# Patient Record
Sex: Male | Born: 1935 | Race: White | Hispanic: No | Marital: Married | Smoking: Former smoker
Health system: Southern US, Community
[De-identification: ages and names within clinical notes are randomized; demographics above are authoritative.]

## PROBLEM LIST (undated history)

## (undated) DIAGNOSIS — N200 Calculus of kidney: Secondary | ICD-10-CM

## (undated) DIAGNOSIS — G44009 Cluster headache syndrome, unspecified, not intractable: Secondary | ICD-10-CM

## (undated) DIAGNOSIS — K219 Gastro-esophageal reflux disease without esophagitis: Secondary | ICD-10-CM

## (undated) DIAGNOSIS — I10 Essential (primary) hypertension: Secondary | ICD-10-CM

## (undated) DIAGNOSIS — I714 Abdominal aortic aneurysm, without rupture, unspecified: Secondary | ICD-10-CM

## (undated) DIAGNOSIS — F0390 Unspecified dementia without behavioral disturbance: Secondary | ICD-10-CM

## (undated) DIAGNOSIS — I2510 Atherosclerotic heart disease of native coronary artery without angina pectoris: Secondary | ICD-10-CM

## (undated) DIAGNOSIS — S86819A Strain of other muscle(s) and tendon(s) at lower leg level, unspecified leg, initial encounter: Secondary | ICD-10-CM

## (undated) DIAGNOSIS — M6530 Trigger finger, unspecified finger: Secondary | ICD-10-CM

## (undated) DIAGNOSIS — E785 Hyperlipidemia, unspecified: Secondary | ICD-10-CM

## (undated) DIAGNOSIS — S838X9A Sprain of other specified parts of unspecified knee, initial encounter: Secondary | ICD-10-CM

## (undated) DIAGNOSIS — M51379 Other intervertebral disc degeneration, lumbosacral region without mention of lumbar back pain or lower extremity pain: Secondary | ICD-10-CM

## (undated) DIAGNOSIS — N400 Enlarged prostate without lower urinary tract symptoms: Secondary | ICD-10-CM

## (undated) DIAGNOSIS — M5137 Other intervertebral disc degeneration, lumbosacral region: Secondary | ICD-10-CM

## (undated) HISTORY — PX: PROSTATECTOMY: SHX69

## (undated) HISTORY — DX: Gastro-esophageal reflux disease without esophagitis: K21.9

## (undated) HISTORY — DX: Calculus of kidney: N20.0

## (undated) HISTORY — DX: Other intervertebral disc degeneration, lumbosacral region without mention of lumbar back pain or lower extremity pain: M51.379

## (undated) HISTORY — DX: Abdominal aortic aneurysm, without rupture, unspecified: I71.40

## (undated) HISTORY — DX: Sprain of other specified parts of unspecified knee, initial encounter: S83.8X9A

## (undated) HISTORY — DX: Essential (primary) hypertension: I10

## (undated) HISTORY — PX: CORONARY ARTERY BYPASS GRAFT: SHX141

## (undated) HISTORY — DX: Hyperlipidemia, unspecified: E78.5

## (undated) HISTORY — DX: Trigger finger, unspecified finger: M65.30

## (undated) HISTORY — PX: INGUINAL HERNIA REPAIR: SHX194

## (undated) HISTORY — DX: Abdominal aortic aneurysm, without rupture: I71.4

## (undated) HISTORY — DX: Atherosclerotic heart disease of native coronary artery without angina pectoris: I25.10

## (undated) HISTORY — DX: Strain of other muscle(s) and tendon(s) at lower leg level, unspecified leg, initial encounter: S86.819A

## (undated) HISTORY — PX: ABDOMINAL AORTIC ANEURYSM REPAIR: SUR1152

## (undated) HISTORY — DX: Unspecified dementia, unspecified severity, without behavioral disturbance, psychotic disturbance, mood disturbance, and anxiety: F03.90

## (undated) HISTORY — PX: NEPHROSTOMY / NEPHROTOMY: SUR883

## (undated) HISTORY — PX: VENTRAL HERNIA REPAIR: SHX424

## (undated) HISTORY — DX: Benign prostatic hyperplasia without lower urinary tract symptoms: N40.0

## (undated) HISTORY — DX: Other intervertebral disc degeneration, lumbosacral region: M51.37

## (undated) HISTORY — DX: Cluster headache syndrome, unspecified, not intractable: G44.009

## (undated) HISTORY — PX: CORONARY STENT PLACEMENT: SHX1402

---

## 1997-12-17 ENCOUNTER — Ambulatory Visit (HOSPITAL_COMMUNITY): Admission: RE | Admit: 1997-12-17 | Discharge: 1997-12-17 | Payer: Self-pay | Admitting: Cardiovascular Disease

## 1998-04-27 ENCOUNTER — Observation Stay (HOSPITAL_COMMUNITY): Admission: EM | Admit: 1998-04-27 | Discharge: 1998-04-28 | Payer: Self-pay | Admitting: Emergency Medicine

## 1998-04-27 ENCOUNTER — Encounter: Payer: Self-pay | Admitting: Emergency Medicine

## 1998-05-01 ENCOUNTER — Ambulatory Visit: Admission: RE | Admit: 1998-05-01 | Discharge: 1998-05-01 | Payer: Self-pay | Admitting: *Deleted

## 1998-07-18 ENCOUNTER — Other Ambulatory Visit: Admission: RE | Admit: 1998-07-18 | Discharge: 1998-07-18 | Payer: Self-pay | Admitting: Urology

## 2000-01-12 ENCOUNTER — Encounter: Payer: Self-pay | Admitting: Cardiology

## 2000-01-13 ENCOUNTER — Encounter: Payer: Self-pay | Admitting: Surgery

## 2000-01-13 ENCOUNTER — Inpatient Hospital Stay (HOSPITAL_COMMUNITY): Admission: AD | Admit: 2000-01-13 | Discharge: 2000-01-18 | Payer: Self-pay | Admitting: Cardiology

## 2000-01-14 ENCOUNTER — Encounter: Payer: Self-pay | Admitting: Surgery

## 2000-01-15 ENCOUNTER — Encounter: Payer: Self-pay | Admitting: Surgery

## 2000-02-16 ENCOUNTER — Encounter (HOSPITAL_COMMUNITY): Admission: RE | Admit: 2000-02-16 | Discharge: 2000-05-16 | Payer: Self-pay | Admitting: *Deleted

## 2001-02-27 ENCOUNTER — Encounter (INDEPENDENT_AMBULATORY_CARE_PROVIDER_SITE_OTHER): Payer: Self-pay

## 2001-02-27 ENCOUNTER — Ambulatory Visit (HOSPITAL_COMMUNITY): Admission: RE | Admit: 2001-02-27 | Discharge: 2001-02-27 | Payer: Self-pay | Admitting: Gastroenterology

## 2001-02-27 LAB — HM COLONOSCOPY: HM Colonoscopy: NORMAL

## 2001-05-18 ENCOUNTER — Encounter: Admission: RE | Admit: 2001-05-18 | Discharge: 2001-06-02 | Payer: Self-pay | Admitting: Sports Medicine

## 2004-02-11 ENCOUNTER — Ambulatory Visit (HOSPITAL_COMMUNITY): Admission: RE | Admit: 2004-02-11 | Discharge: 2004-02-11 | Payer: Self-pay | Admitting: Urology

## 2004-02-11 ENCOUNTER — Encounter: Admission: RE | Admit: 2004-02-11 | Discharge: 2004-02-11 | Payer: Self-pay | Admitting: Urology

## 2004-02-11 IMAGING — CR DG CHEST 2V
2 series · 2 of 2 positions shown · non-contrast
Comparison: none

CLINICAL DATA: Cough.  Pre-op bladder surgery. 
 CHEST
 Two views of the chest show the lungs to be clear and slightly hyperaerated.  The heart is within upper limits of normal.  Median sternotomy sutures are noted from prior CABG. 
 IMPRESSION
 No active lung disease.

[view not recorded (1 of 2)]
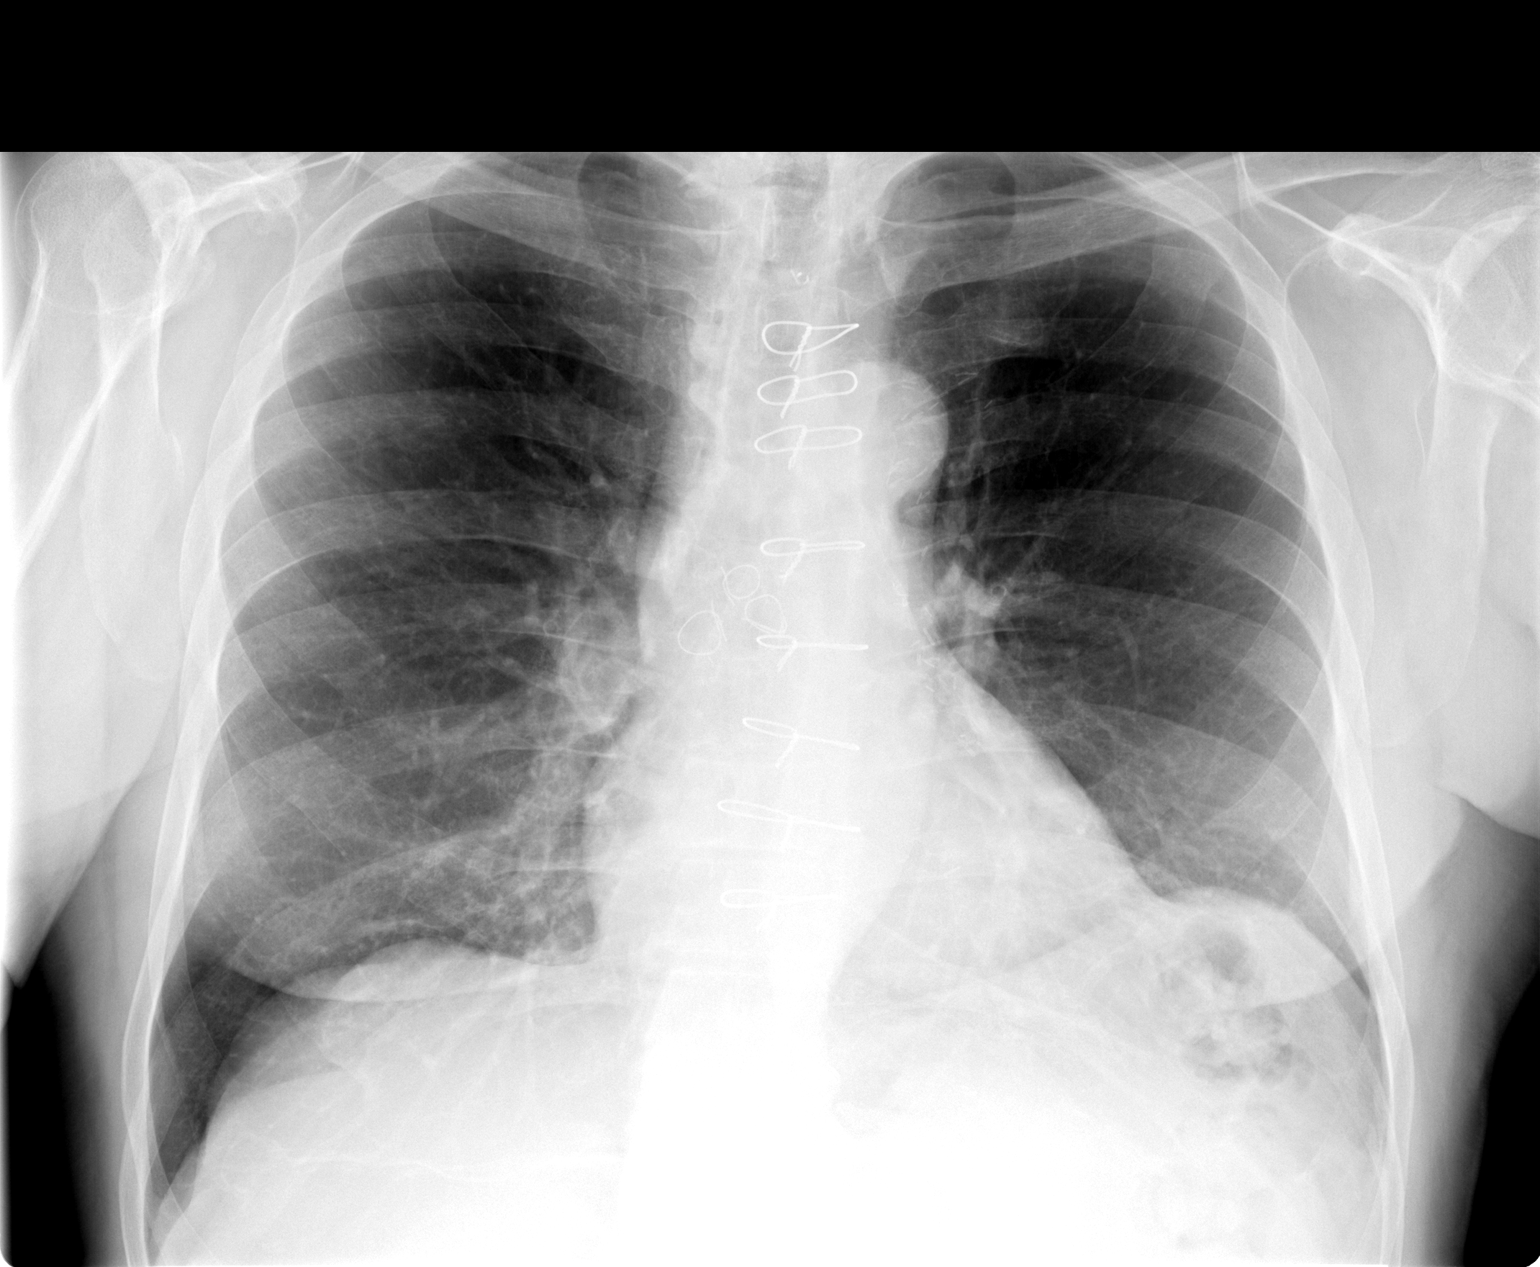

[view not recorded (2 of 2)]
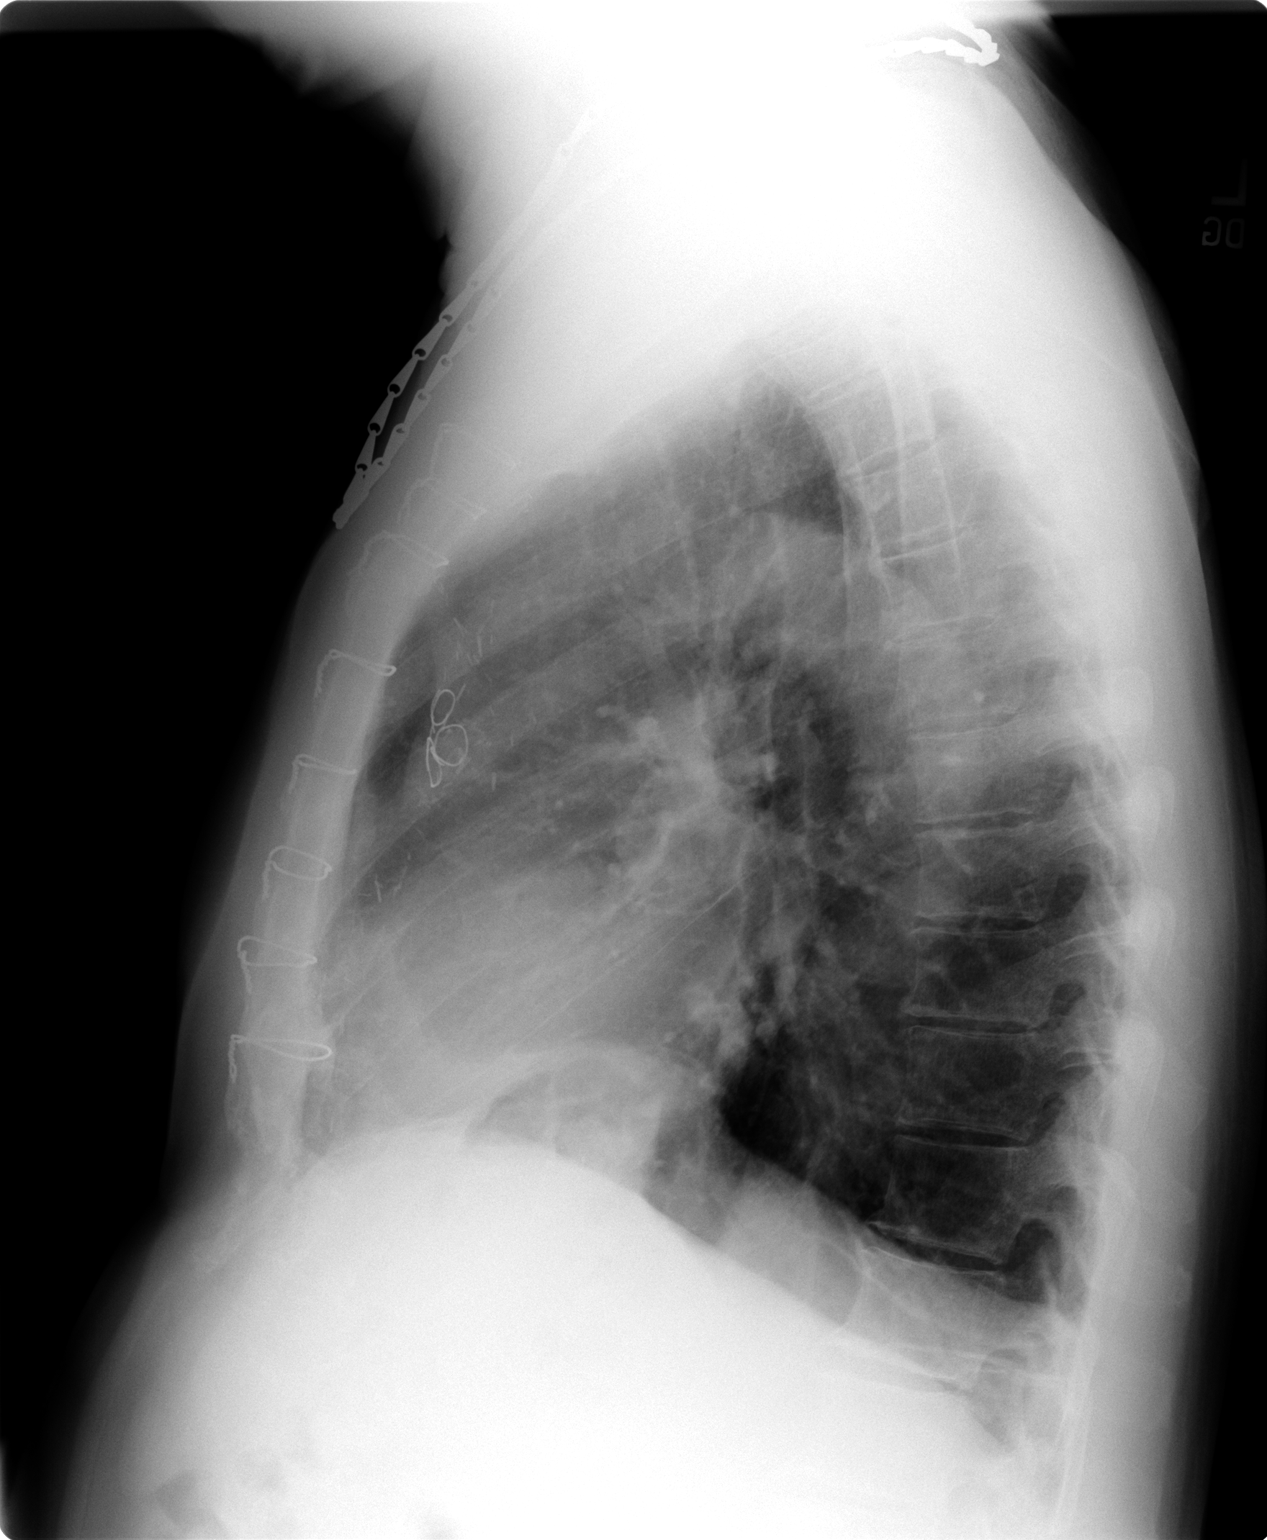

[2 of 2 positions shown; findings below may reference images not displayed]

## 2004-02-24 ENCOUNTER — Inpatient Hospital Stay (HOSPITAL_COMMUNITY): Admission: RE | Admit: 2004-02-24 | Discharge: 2004-02-28 | Payer: Self-pay | Admitting: Urology

## 2004-02-24 ENCOUNTER — Encounter (INDEPENDENT_AMBULATORY_CARE_PROVIDER_SITE_OTHER): Payer: Self-pay | Admitting: Specialist

## 2005-04-25 ENCOUNTER — Encounter: Admission: RE | Admit: 2005-04-25 | Discharge: 2005-04-25 | Payer: Self-pay | Admitting: Neurosurgery

## 2006-03-02 ENCOUNTER — Ambulatory Visit: Payer: Self-pay | Admitting: *Deleted

## 2006-03-03 ENCOUNTER — Ambulatory Visit: Payer: Self-pay | Admitting: *Deleted

## 2006-12-21 ENCOUNTER — Encounter: Admission: RE | Admit: 2006-12-21 | Discharge: 2007-01-16 | Payer: Self-pay | Admitting: Sports Medicine

## 2007-03-24 ENCOUNTER — Ambulatory Visit: Payer: Self-pay | Admitting: Internal Medicine

## 2007-06-05 ENCOUNTER — Encounter: Payer: Self-pay | Admitting: Internal Medicine

## 2007-12-21 ENCOUNTER — Ambulatory Visit: Payer: Self-pay | Admitting: Cardiology

## 2008-01-02 ENCOUNTER — Ambulatory Visit: Payer: Self-pay | Admitting: Internal Medicine

## 2008-01-02 DIAGNOSIS — M6530 Trigger finger, unspecified finger: Secondary | ICD-10-CM | POA: Insufficient documentation

## 2008-01-03 ENCOUNTER — Ambulatory Visit: Payer: Self-pay | Admitting: Internal Medicine

## 2008-01-03 ENCOUNTER — Encounter: Payer: Self-pay | Admitting: Internal Medicine

## 2008-01-03 DIAGNOSIS — K219 Gastro-esophageal reflux disease without esophagitis: Secondary | ICD-10-CM | POA: Insufficient documentation

## 2008-01-03 DIAGNOSIS — I2510 Atherosclerotic heart disease of native coronary artery without angina pectoris: Secondary | ICD-10-CM | POA: Insufficient documentation

## 2008-01-03 DIAGNOSIS — N200 Calculus of kidney: Secondary | ICD-10-CM | POA: Insufficient documentation

## 2008-01-03 DIAGNOSIS — E785 Hyperlipidemia, unspecified: Secondary | ICD-10-CM

## 2008-01-03 DIAGNOSIS — G44009 Cluster headache syndrome, unspecified, not intractable: Secondary | ICD-10-CM | POA: Insufficient documentation

## 2008-01-03 DIAGNOSIS — Z9889 Other specified postprocedural states: Secondary | ICD-10-CM | POA: Insufficient documentation

## 2008-01-03 DIAGNOSIS — M5137 Other intervertebral disc degeneration, lumbosacral region: Secondary | ICD-10-CM

## 2008-01-03 DIAGNOSIS — N400 Enlarged prostate without lower urinary tract symptoms: Secondary | ICD-10-CM | POA: Insufficient documentation

## 2008-01-05 ENCOUNTER — Telehealth: Payer: Self-pay | Admitting: Internal Medicine

## 2008-01-11 ENCOUNTER — Encounter: Payer: Self-pay | Admitting: Internal Medicine

## 2008-01-17 ENCOUNTER — Encounter: Payer: Self-pay | Admitting: Internal Medicine

## 2008-01-19 ENCOUNTER — Encounter: Payer: Self-pay | Admitting: Internal Medicine

## 2008-01-31 ENCOUNTER — Encounter: Payer: Self-pay | Admitting: Internal Medicine

## 2008-02-01 ENCOUNTER — Encounter: Payer: Self-pay | Admitting: Pulmonary Disease

## 2008-02-01 ENCOUNTER — Ambulatory Visit: Payer: Self-pay

## 2008-02-07 ENCOUNTER — Telehealth: Payer: Self-pay | Admitting: Internal Medicine

## 2008-03-06 ENCOUNTER — Encounter: Payer: Self-pay | Admitting: Internal Medicine

## 2008-03-26 ENCOUNTER — Encounter: Payer: Self-pay | Admitting: Internal Medicine

## 2008-04-19 ENCOUNTER — Encounter (INDEPENDENT_AMBULATORY_CARE_PROVIDER_SITE_OTHER): Payer: Self-pay | Admitting: Orthopedic Surgery

## 2008-04-19 ENCOUNTER — Ambulatory Visit (HOSPITAL_BASED_OUTPATIENT_CLINIC_OR_DEPARTMENT_OTHER): Admission: RE | Admit: 2008-04-19 | Discharge: 2008-04-19 | Payer: Self-pay | Admitting: Orthopedic Surgery

## 2008-04-25 ENCOUNTER — Encounter: Payer: Self-pay | Admitting: Internal Medicine

## 2008-04-26 ENCOUNTER — Encounter: Payer: Self-pay | Admitting: Internal Medicine

## 2008-05-02 ENCOUNTER — Encounter: Payer: Self-pay | Admitting: Internal Medicine

## 2008-05-17 ENCOUNTER — Ambulatory Visit: Payer: Self-pay | Admitting: Internal Medicine

## 2008-05-17 DIAGNOSIS — D179 Benign lipomatous neoplasm, unspecified: Secondary | ICD-10-CM | POA: Insufficient documentation

## 2008-05-20 ENCOUNTER — Encounter: Admission: RE | Admit: 2008-05-20 | Discharge: 2008-05-20 | Payer: Self-pay | Admitting: Neurology

## 2008-05-21 ENCOUNTER — Emergency Department (HOSPITAL_COMMUNITY): Admission: EM | Admit: 2008-05-21 | Discharge: 2008-05-22 | Payer: Self-pay | Admitting: Emergency Medicine

## 2008-05-22 ENCOUNTER — Ambulatory Visit: Payer: Self-pay | Admitting: Cardiology

## 2008-05-22 ENCOUNTER — Ambulatory Visit: Payer: Self-pay

## 2008-05-30 ENCOUNTER — Encounter: Payer: Self-pay | Admitting: Internal Medicine

## 2008-05-31 ENCOUNTER — Encounter: Admission: RE | Admit: 2008-05-31 | Discharge: 2008-05-31 | Payer: Self-pay | Admitting: Neurology

## 2008-10-07 ENCOUNTER — Encounter: Payer: Self-pay | Admitting: Internal Medicine

## 2008-10-28 ENCOUNTER — Encounter: Payer: Self-pay | Admitting: Internal Medicine

## 2008-12-18 ENCOUNTER — Telehealth: Payer: Self-pay | Admitting: Internal Medicine

## 2008-12-18 ENCOUNTER — Ambulatory Visit: Payer: Self-pay | Admitting: Internal Medicine

## 2008-12-18 DIAGNOSIS — F0390 Unspecified dementia without behavioral disturbance: Secondary | ICD-10-CM | POA: Insufficient documentation

## 2008-12-23 ENCOUNTER — Encounter: Payer: Self-pay | Admitting: Cardiology

## 2008-12-23 ENCOUNTER — Encounter: Payer: Self-pay | Admitting: Internal Medicine

## 2008-12-24 ENCOUNTER — Encounter: Admission: RE | Admit: 2008-12-24 | Discharge: 2008-12-24 | Payer: Self-pay | Admitting: Orthopedic Surgery

## 2008-12-26 ENCOUNTER — Encounter: Payer: Self-pay | Admitting: Internal Medicine

## 2008-12-26 ENCOUNTER — Encounter: Payer: Self-pay | Admitting: Cardiology

## 2008-12-30 ENCOUNTER — Encounter: Payer: Self-pay | Admitting: Cardiology

## 2008-12-31 ENCOUNTER — Inpatient Hospital Stay (HOSPITAL_COMMUNITY): Admission: RE | Admit: 2008-12-31 | Discharge: 2009-01-01 | Payer: Self-pay | Admitting: Orthopedic Surgery

## 2009-01-15 ENCOUNTER — Telehealth: Payer: Self-pay | Admitting: Internal Medicine

## 2009-01-16 ENCOUNTER — Ambulatory Visit: Payer: Self-pay | Admitting: Internal Medicine

## 2009-04-15 ENCOUNTER — Emergency Department (HOSPITAL_COMMUNITY): Admission: EM | Admit: 2009-04-15 | Discharge: 2009-04-15 | Payer: Self-pay | Admitting: Emergency Medicine

## 2009-05-06 ENCOUNTER — Ambulatory Visit: Payer: Self-pay | Admitting: Cardiology

## 2009-05-06 ENCOUNTER — Telehealth: Payer: Self-pay | Admitting: Cardiology

## 2009-05-06 LAB — CONVERTED CEMR LAB
Eosinophils Absolute: 0.4 10*3/uL (ref 0.0–0.7)
Eosinophils Relative: 6.5 % — ABNORMAL HIGH (ref 0.0–5.0)
Hemoglobin: 16 g/dL (ref 13.0–17.0)
Lymphs Abs: 1 10*3/uL (ref 0.7–4.0)
MCHC: 33.5 g/dL (ref 30.0–36.0)
Neutro Abs: 3.6 10*3/uL (ref 1.4–7.7)

## 2009-05-09 ENCOUNTER — Encounter: Payer: Self-pay | Admitting: Cardiology

## 2009-06-10 ENCOUNTER — Encounter (INDEPENDENT_AMBULATORY_CARE_PROVIDER_SITE_OTHER): Payer: Self-pay | Admitting: *Deleted

## 2009-09-26 ENCOUNTER — Telehealth: Payer: Self-pay | Admitting: Cardiology

## 2009-12-17 ENCOUNTER — Ambulatory Visit: Payer: Self-pay | Admitting: Cardiology

## 2009-12-17 DIAGNOSIS — I10 Essential (primary) hypertension: Secondary | ICD-10-CM

## 2009-12-23 ENCOUNTER — Telehealth: Payer: Self-pay | Admitting: Cardiology

## 2009-12-24 ENCOUNTER — Ambulatory Visit: Payer: Self-pay | Admitting: Cardiology

## 2009-12-24 LAB — CONVERTED CEMR LAB
ALT: 16 units/L (ref 0–53)
Basophils Absolute: 0 10*3/uL (ref 0.0–0.1)
Bilirubin, Direct: 0.1 mg/dL (ref 0.0–0.3)
CO2: 31 meq/L (ref 19–32)
Chloride: 103 meq/L (ref 96–112)
Cholesterol: 152 mg/dL (ref 0–200)
Eosinophils Absolute: 0.2 10*3/uL (ref 0.0–0.7)
Eosinophils Relative: 2.8 % (ref 0.0–5.0)
GFR calc non Af Amer: 86.84 mL/min (ref >60)
Hemoglobin: 16.3 g/dL (ref 13.0–17.0)
Lymphocytes Relative: 17.5 % (ref 12.0–46.0)
Lymphs Abs: 1.1 10*3/uL (ref 0.7–4.0)
MCHC: 34.1 g/dL (ref 30.0–36.0)
MCV: 89.8 fL (ref 78.0–100.0)
Neutro Abs: 4.2 10*3/uL (ref 1.4–7.7)
RBC: 5.31 M/uL (ref 4.22–5.81)
Sodium: 141 meq/L (ref 135–145)
Total Protein: 6.4 g/dL (ref 6.0–8.3)
Triglycerides: 158 mg/dL — ABNORMAL HIGH (ref 0.0–149.0)
VLDL: 31.6 mg/dL (ref 0.0–40.0)
WBC: 6.1 10*3/uL (ref 4.5–10.5)

## 2009-12-29 ENCOUNTER — Ambulatory Visit: Payer: Self-pay | Admitting: Internal Medicine

## 2009-12-29 ENCOUNTER — Telehealth (INDEPENDENT_AMBULATORY_CARE_PROVIDER_SITE_OTHER): Payer: Self-pay | Admitting: *Deleted

## 2009-12-29 DIAGNOSIS — G609 Hereditary and idiopathic neuropathy, unspecified: Secondary | ICD-10-CM | POA: Insufficient documentation

## 2009-12-29 LAB — CONVERTED CEMR LAB
Folate: >20 ng/mL
TSH: 0.6 microintl units/mL (ref 0.35–5.50)
Vitamin B-12: 730 pg/mL (ref 211–911)

## 2009-12-30 ENCOUNTER — Ambulatory Visit: Payer: Self-pay | Admitting: Cardiology

## 2009-12-30 DIAGNOSIS — E875 Hyperkalemia: Secondary | ICD-10-CM | POA: Insufficient documentation

## 2010-01-01 ENCOUNTER — Encounter: Payer: Self-pay | Admitting: Cardiology

## 2010-01-05 LAB — CONVERTED CEMR LAB
CO2: 29 meq/L (ref 19–32)
GFR calc non Af Amer: 87.96 mL/min (ref >60)

## 2010-01-14 ENCOUNTER — Encounter: Payer: Self-pay | Admitting: Internal Medicine

## 2010-01-14 DIAGNOSIS — I739 Peripheral vascular disease, unspecified: Secondary | ICD-10-CM

## 2010-01-15 ENCOUNTER — Encounter: Payer: Self-pay | Admitting: Cardiology

## 2010-01-15 ENCOUNTER — Ambulatory Visit: Payer: Self-pay

## 2010-01-15 ENCOUNTER — Encounter: Payer: Self-pay | Admitting: Internal Medicine

## 2010-01-15 ENCOUNTER — Ambulatory Visit: Payer: Self-pay | Admitting: Cardiology

## 2010-01-29 LAB — CONVERTED CEMR LAB
CO2: 30 meq/L (ref 19–32)
Glucose, Bld: 78 mg/dL (ref 70–99)

## 2010-02-23 ENCOUNTER — Ambulatory Visit: Payer: Self-pay | Admitting: Internal Medicine

## 2010-02-23 LAB — CONVERTED CEMR LAB
ALT: 21 units/L (ref 0–53)
AST: 26 units/L (ref 0–37)
Bilirubin, Direct: 0.2 mg/dL (ref 0.0–0.3)
CO2: 32 meq/L (ref 19–32)
Chloride: 110 meq/L (ref 96–112)
HDL: 40.1 mg/dL (ref >39.00)
LDL Cholesterol: 74 mg/dL (ref 0–99)
Potassium: 4.6 meq/L (ref 3.5–5.1)
Sodium: 144 meq/L (ref 135–145)
TSH: 0.59 microintl units/mL (ref 0.35–5.50)
Total Bilirubin: 0.9 mg/dL (ref 0.3–1.2)
Total Protein: 6.8 g/dL (ref 6.0–8.3)

## 2010-02-25 ENCOUNTER — Encounter: Payer: Self-pay | Admitting: Internal Medicine

## 2010-04-09 ENCOUNTER — Ambulatory Visit: Payer: Self-pay | Admitting: Internal Medicine

## 2010-04-16 ENCOUNTER — Ambulatory Visit: Payer: Self-pay | Admitting: Internal Medicine

## 2010-04-24 ENCOUNTER — Encounter: Payer: Self-pay | Admitting: Internal Medicine

## 2010-04-28 ENCOUNTER — Encounter: Payer: Self-pay | Admitting: Internal Medicine

## 2010-04-29 ENCOUNTER — Encounter: Payer: Self-pay | Admitting: Internal Medicine

## 2010-04-29 ENCOUNTER — Ambulatory Visit: Payer: Self-pay | Admitting: Internal Medicine

## 2010-05-12 ENCOUNTER — Ambulatory Visit: Payer: Self-pay | Admitting: Cardiology

## 2010-06-08 ENCOUNTER — Encounter: Payer: Self-pay | Admitting: Internal Medicine

## 2010-07-06 ENCOUNTER — Encounter: Payer: Self-pay | Admitting: Internal Medicine

## 2010-08-31 ENCOUNTER — Encounter: Payer: Self-pay | Admitting: Orthopedic Surgery

## 2010-09-08 NOTE — Letter (Signed)
Summary: [facility]  [facility]   Imported By: Lester Donnybrook 05/04/2010 08:29:04  _____________________________________________________________________  External Attachment:    Type:   Image     Comment:   External Document

## 2010-09-08 NOTE — Assessment & Plan Note (Signed)
Summary: U9W   Primary Provider:  Norins   History of Present Illness: The patient is 77 years old and return for management of CAD. He had bypass surgery in 2000. In April 2009 and a bare-metal stent to the saphenous vein graft to the diagonal branch he had been doing quite well with no chest pain shortness of breath or palpitations.  His other problems include hyperlipidemia, GERD, and he is status post previous aortic stent graft.  He was having some easy bruising and we discontinued his Plavix a few months ago.  He is living in [place] over the winter time and comes to [place] in the summer.  Current Medications (verified): 1)  Vytorin 10-40 Mg  Tabs (Ezetimibe-Simvastatin) .... Take 1 Tablet By Mouth Once A Day 2)  Adult Aspirin Low Strength 81 Mg  Tbdp (Aspirin) .... Take 1 Tablet By Mouth Once A Day 3)  Ramipril 10 Mg  Caps (Ramipril) .... Take 1 Tablet By Mouth Once A Day 4)  Metoprolol Succinate 50 Mg  Tb24 (Metoprolol Succinate) .... Take 1 Tablet By Mouth Once A Day 5)  Famotidine 20 Mg Tabs (Famotidine) .Marland Kitchen.. 1 By Mouth Two Times A Day  Allergies (verified): No Known Drug Allergies  Past History:  Past Medical History: Reviewed history from 12/17/2009 and no changes required. Current Problems:  CORONARY ARTERY DISEASE (ICD-414.00) HYPERTENSION (ICD-401.9) ENCOUNTER FOR LONG-TERM USE OF OTHER MEDICATIONS (ICD-V58.69) DEMENTIA (ICD-294.8) SPRAIN&STRAIN OTHER SPECIFIED SITES KNEE&LEG (ICD-844.8) LIPOMA (ICD-214.9) Hx of TRIGGER FINGER (ICD-727.03)-s/p multiple injections DEGENERATIVE DISC DISEASE, LUMBOSACRAL SPINE (ICD-722.52)- MRI March '04, recent flare with radicular         symptoms May '09 OTHER AND UNSPECIFIED HYPERLIPIDEMIA (ICD-272.4) Hx of HEADACHE, CLUSTER (ICD-339.00) Hx of NEPHROLITHIASIS (ICD-592.0) HYPERTROPHY PROSTATE W/O UR OBST & OTH LUTS (ICD-600.00) GERD (ICD-530.81) ABDOMINAL AORTIC ANEURYSM REPAIR, HX OF (ICD-V15.1)  Ecchymoses right thigh  probably related to minor trauma plus Plavix, rule out evidence of deep vein thrombosis.   Physician Roster:               GI - Dr. Kinnie Scales               Card - Dr. Juanda Chance               Allergy - Dr. Ilean China - Dr. Londell Moh               Urology - Dr. Patsi Sears               ENT- Dr. Dorma Russell               Neurology - Dr. Sandria Manly               Neurosurgery - Dr. Margarite Gouge - Dr. Thurston Hole               Hand surgery - Dr. Teressa Senter  Physician roster - [facility]             cardiology - Dr. Virgina Evener [address], [place] [address]             IM - Dr. Judithann Sauger, [email or web address], (c) [phone]             Vascular surg - Dr. Felipa Eth, [facility],  [facility], [address] [place], [place] [address],              Neurology - Renda Rolls, [address] [place]  [address], [phone]  Review of Systems       ROS is negative except as outlined in HPI.   Vital Signs:  Patient profile:   77 year old male Height:      67 inches Weight:      178 pounds BMI:     27.98 Pulse rate:   60 / minute BP sitting:   120 / 70  (left arm) Cuff size:   regular  Vitals Entered By: Burnett Kanaris, CNA (Dec 17, 2009 8:47 AM)  Physical Exam  Additional Exam:  Gen. Well-nourished, in no distress   Neck: No JVD, thyroid not enlarged, no carotid bruits Lungs: No tachypnea, clear without rales, rhonchi or wheezes Cardiovascular: Rhythm regular, PMI not displaced,  heart sounds  normal, no murmurs or gallops, no peripheral edema, pulses normal in all 4 extremities. Abdomen: BS normal, abdomen soft and non-tender without masses or organomegaly, no hepatosplenomegaly. MS: No deformities, no cyanosis or clubbing   Neuro:  No focal sns   Skin:  no lesions    Impression & Recommendations:  Problem # 1:  CORONARY ARTERY DISEASE (ICD-414.00) He had bypass  surgery in 2000 and stenting of the vein graft as described in history of present illness. He has had no recent chest pain his palm appears stable. He is now off Plavix. He is still having some easy bruising so he cut his aspirin back from 81 mg twice a day to once a day.  I talked to him about transitioning his care to Dr. Gala Romney His updated medication list for this problem includes:    Adult Aspirin Low Strength 81 Mg Tbdp (Aspirin) .Marland Kitchen... Take 1 tablet by mouth once a day    Ramipril 10 Mg Caps (Ramipril) .Marland Kitchen... Take 1 tablet by mouth once a day    Metoprolol Succinate 50 Mg Tb24 (Metoprolol succinate) .Marland Kitchen... Take 1 tablet by mouth once a day  Orders: EKG w/ Interpretation (93000)  His updated medication list for this problem includes:    Adult Aspirin Low Strength 81 Mg Tbdp (Aspirin) .Marland Kitchen... Take 1 tablet by mouth once a day    Ramipril 10 Mg Caps (Ramipril) .Marland Kitchen... Take 1 tablet by mouth once a day    Metoprolol Succinate 50 Mg Tb24 (Metoprolol succinate) .Marland Kitchen... Take 1 tablet by mouth once a day  Problem # 2:  HYPERTENSION (ICD-401.9)  This is well controlled on current medications. His updated medication list for this problem includes:    Adult Aspirin Low Strength 81 Mg Tbdp (Aspirin) .Marland Kitchen... Take 1 tablet by mouth once a day    Ramipril 10 Mg Caps (Ramipril) .Marland Kitchen... Take 1 tablet by mouth once a day    Metoprolol Succinate 50 Mg Tb24 (Metoprolol succinate) .Marland Kitchen... Take 1 tablet by mouth once a day  His updated medication list for this problem includes:    Adult Aspirin Low Strength 81 Mg Tbdp (Aspirin) .Marland Kitchen... Take 1 tablet by mouth once a day    Ramipril 10 Mg Caps (Ramipril) .Marland Kitchen... Take 1 tablet by mouth once a day    Metoprolol Succinate 50 Mg Tb24 (Metoprolol succinate) .Marland Kitchen... Take 1 tablet by mouth once a day  Problem # 3:  HYPERLIPIDEMIA-MIXED (ICD-272.4)  We will get a fasting lipid profile. His updated medication list for this problem  includes:    Vytorin 10-40 Mg Tabs  (Ezetimibe-simvastatin) .Marland Kitchen... Take 1 tablet by mouth once a day  His updated medication list for this problem includes:    Vytorin 10-40 Mg Tabs (Ezetimibe-simvastatin) .Marland Kitchen... Take 1 tablet by mouth once a day  Patient Instructions: 1)  Your physician recommends that you return for FASTING lab work on a day that is good for you: lipid/liver/cbc/bmet (414.01;272.2) 2)  Your physician has recommended you make the following change in your medication: 1) decrease aspirin to 81mg  once daily, 2) stop prevacid. 3)  Your physician wants you to follow-up in: October in 2011.  You will receive a reminder letter in the mail two months in advance. If you don't receive a letter, please call our office to schedule the follow-up appointment.

## 2010-09-08 NOTE — Miscellaneous (Signed)
Summary: Ramipril 5 mg  Clinical Lists Changes  Medications: Changed medication from RAMIPRIL 10 MG  CAPS (RAMIPRIL) Take 1 tablet by mouth once a day to RAMIPRIL 5 MG CAPS (RAMIPRIL) Take one capsule by mouth daily - Signed Rx of RAMIPRIL 5 MG CAPS (RAMIPRIL) Take one capsule by mouth daily;  #30 x 11;  Signed;  Entered by: Sherri Rad, RN, BSN;  Authorized by: Lenoria Farrier, MD, [facility];  Method used: Electronically to [facility]. [phone]*, [address], [place], [place], [place]  [address], Ph: [phone], Fax: [phone]    Prescriptions: RAMIPRIL 5 MG CAPS (RAMIPRIL) Take one capsule by mouth daily  #30 x 11   Entered by:   Sherri Rad, RN, BSN   Authorized by:   Lenoria Farrier, MD, [facility]   Signed by:   Sherri Rad, RN, BSN on 01/01/2010   Method used:   Electronically to        [facility]. [phone]* (retail)       [address]       [place], [place]  [address]       Ph: [phone]       Fax: [phone]   RxID:   [id number]

## 2010-09-08 NOTE — Consult Note (Signed)
Summary: [facility]  [facility]   Imported By: Lennie Odor 05/01/2010 12:07:18  _____________________________________________________________________  External Attachment:    Type:   Image     Comment:   External Document

## 2010-09-08 NOTE — Assessment & Plan Note (Signed)
Summary: bp check; DR. Juanda Chance TO REVIEW   01/22/10  js  Nurse Visit   Vital Signs:  Patient profile:   77 year old male BP sitting:   134 / 70  (left arm) Cuff size:   large  Vitals Entered By: Sherri Rad, RN, BSN (January 15, 2010 4:01 PM)  Allergies: No Known Drug Allergies

## 2010-09-08 NOTE — Progress Notes (Signed)
----   Converted from flag ---- ---- 12/29/2009 11:15 AM, Edman Circle wrote: appt 6/9 @ 1:30  ---- 12/29/2009 10:30 AM, Dagoberto Reef wrote: Thanks  ---- 12/29/2009 10:14 AM, Jacques Navy MD wrote: The following orders have been entered for this patient and placed on Admin Hold:  Type:     Referral       Code:   Le arterial doppler Description:   LE Arterial Doppler/ABI Order Date:   12/29/2009   Authorized By:   Jacques Navy MD Order #:   [id number] Clinical Notes:   Appt Date: Appt Time: Appt Location: ------------------------------

## 2010-09-08 NOTE — Assessment & Plan Note (Signed)
Summary: rov   Visit Type:  Follow-up Primary Provider:  Norins  CC:  no complaints.  History of Present Illness: Trevor Huffman is 77 years old and return for management of CAD. He had bypass surgery in 2000 after multiple PCI procedures. In April 2009 he had a bare-metal stent to the saphenous vein graft to the diagonal branch of LAD in [place]. He has done very well since that time has had no recent chest pain shortness of breath or palpitations.   His other problems include hyperlipidemia and he had a good lipid profile in May of this year. He also has had an aortic stent graft.  he has had some problems recently with his memory.  His wife has seen Dr. Gala Romney known and we plan to arrange his followup with Dr. Gala Romney known next year.  Current Medications (verified): 1)  Vytorin 10-40 Mg  Tabs (Ezetimibe-Simvastatin) .... Take 1 Tablet By Mouth Once A Day 2)  Adult Aspirin Low Strength 81 Mg  Tbdp (Aspirin) .... Take 1 Tablet By Mouth Once A Day 3)  Ramipril 5 Mg Caps (Ramipril) .... Take One Capsule By Mouth Daily 4)  Metoprolol Succinate 50 Mg  Tb24 (Metoprolol Succinate) .... Take 1 Tablet By Mouth Once A Day 5)  Famotidine 20 Mg Tabs (Famotidine) .Marland Kitchen.. 1 By Mouth Two Times A Day 6)  Glucosamine-Chondroitin  Caps (Glucosamine-Chondroit-Vit C-Mn) .... Take One Tablet Three Times A Day 7)  Mylanta 200-200-20 Mg/24ml Susp (Alum & Mag Hydroxide-Simeth) .... 30 Cc As Needed  Allergies (verified): No Known Drug Allergies  Past History:  Past Medical History: Reviewed history from 12/29/2009 and no changes required.  CORONARY ARTERY DISEASE (ICD-414.00) HYPERTENSION (ICD-401.9) ENCOUNTER FOR LONG-TERM USE OF OTHER MEDICATIONS (ICD-V58.69) DEMENTIA (ICD-294.8) SPRAIN&STRAIN OTHER SPECIFIED SITES KNEE&LEG (ICD-844.8) LIPOMA (ICD-214.9) Hx of TRIGGER FINGER (ICD-727.03)-s/p multiple injections DEGENERATIVE DISC DISEASE, LUMBOSACRAL SPINE (ICD-722.52)- MRI March '04, recent flare  with radicular         symptoms May '09 OTHER AND UNSPECIFIED HYPERLIPIDEMIA (ICD-272.4) Hx of HEADACHE, CLUSTER (ICD-339.00) Hx of NEPHROLITHIASIS (ICD-592.0) HYPERTROPHY PROSTATE W/O UR OBST & OTH LUTS (ICD-600.00) GERD (ICD-530.81) ABDOMINAL AORTIC ANEURYSM REPAIR, HX OF (ICD-V15.1)  Physician Roster:               GI - Dr. Kinnie Scales               Card - Dr. Juanda Chance               Allergy - Dr. Ilean China - Dr. Londell Moh               Urology - Dr. Patsi Sears               ENT- Dr. Dorma Russell               Neurology - Dr. Sandria Manly               Neurosurgery - Dr. Margarite Gouge - Dr. Thurston Hole               Hand surgery - Dr. Teressa Senter  Physician roster - [facility]             cardiology - Dr. Virgina Evener [address], [place] [address]  IM - Dr. Judithann Sauger, [email or web address], (c) [phone]             Vascular surg - Dr. Felipa Eth, [facility],                                        [place], [place]. [address] [place], [place] [address],              Neurology - Renda Rolls, [address] [place]  [address], [phone]  Review of Systems       ROS is negative except as outlined in HPI.   Vital Signs:  Patient profile:   77 year old male Height:      67 inches Weight:      178 pounds BMI:     27.98 Pulse rate:   55 / minute BP sitting:   140 / 80  (left arm) Cuff size:   regular  Vitals Entered By: Burnett Kanaris, CNA (May 12, 2010 2:56 PM)  Physical Exam  Additional Exam:  Gen. Well-nourished, in no distress   Neck: No JVD, thyroid not enlarged, no carotid bruits Lungs: No tachypnea, clear without rales, rhonchi or wheezes Cardiovascular: Rhythm regular, PMI not displaced,  heart sounds  normal, no murmurs or gallops, no peripheral edema, pulses normal in all 4 extremities. Abdomen: BS normal, abdomen soft and non-tender without masses or organomegaly, no  hepatosplenomegaly. MS: No deformities, no cyanosis or clubbing   Neuro:  No focal sns   Skin:  no lesions    Impression & Recommendations:  Problem # 1:  CORONARY ARTERY DISEASE (ICD-414.00)  He had previous bypass surgery and a bare metal stent procedure in April 2009. He has had no recent chest pain. This problem appears stable. His updated medication list for this problem includes:    Adult Aspirin Low Strength 81 Mg Tbdp (Aspirin) .Marland Kitchen... Take 1 tablet by mouth once a day    Ramipril 5 Mg Caps (Ramipril) .Marland Kitchen... Take one capsule by mouth daily    Metoprolol Succinate 50 Mg Tb24 (Metoprolol succinate) .Marland Kitchen... Take 1 tablet by mouth once a day  Orders: EKG w/ Interpretation ([address])  Problem # 2:  HYPERLIPIDEMIA-MIXED (ICD-272.4) His recent lipid profile was very good on current medications. His updated medication list for this problem includes:    Vytorin 10-40 Mg Tabs (Ezetimibe-simvastatin) .Marland Kitchen... Take 1 tablet by mouth once a day  Problem # 3:  DEMENTIA (ICD-294.8) He has had some recent memory problems.  Patient Instructions: 1)  Your physician wants you to follow-up in: May 2012 with Dr. Gala Romney.   You will receive a reminder letter in the mail two months in advance. If you don't receive a letter, please call our office to schedule the follow-up appointment. 2)  Your physician recommends that you continue on your current medications as directed. Please refer to the Current Medication list given to you today.

## 2010-09-08 NOTE — Miscellaneous (Signed)
Summary: Orders Update  Clinical Lists Changes  Problems: Added new problem of PVD (ICD-443.9) Orders: Added new Test order of Arterial Duplex Lower Extremity (Arterial Duplex Low) - Signed 

## 2010-09-08 NOTE — Assessment & Plan Note (Signed)
Summary: per flag needs ov/cd   Vital Signs:  Trevor Huffman profile:   77 year old male Height:      67 inches Weight:      186 pounds BMI:     29.24 O2 Sat:      94 % on Room air Temp:     97.0 degrees F oral Pulse rate:   58 / minute BP sitting:   108 / 62  (left arm) Cuff size:   regular  Vitals Entered By: Alysia Penna (April 16, 2010 2:07 PM)  O2 Flow:  Room air   Primary Care Provider:  Raphaella Larkin   History of Present Illness: Trevor Huffman returns for further evaluation of memory. He was recently seen and it was remarkable that he had no memory of is recent mid-July visit. Due to concern about short-term memory loss as well as a pattern of perseveration and repetitive statements Namenda was prescribed. He was very resistant to the concept of having any memory impairment. He was asked to return for MMSE.  Current Medications (verified): 1)  Vytorin 10-40 Mg  Tabs (Ezetimibe-Simvastatin) .... Take 1 Tablet By Mouth Once A Day 2)  Adult Aspirin Low Strength 81 Mg  Tbdp (Aspirin) .... Take 1 Tablet By Mouth Once A Day 3)  Ramipril 5 Mg Caps (Ramipril) .... Take One Capsule By Mouth Daily 4)  Metoprolol Succinate 50 Mg  Tb24 (Metoprolol Succinate) .... Take 1 Tablet By Mouth Once A Day 5)  Famotidine 20 Mg Tabs (Famotidine) .Marland Kitchen.. 1 By Mouth Two Times A Day 6)  Glucosamine-Chondroitin  Caps (Glucosamine-Chondroit-Vit C-Mn) .... Take One Tablet Three Times A Day 7)  Mylanta 200-200-20 Mg/24ml Susp (Alum & Mag Hydroxide-Simeth) .... 30 Cc As Needed 8)  Namenda 5 Mg Tabs (Memantine Hcl) .Marland Kitchen.. 1 Once Daily X 7, 1 Two Times A Day X 7, 2 Am With 1 Pm X 7, Then 2 Tabs Two Times A Day.  Allergies (verified): No Known Drug Allergies PMH-FH-SH reviewed-no changes except otherwise noted  Review of Systems  The Trevor Huffman denies anorexia, fever, weight loss, weight gain, vision loss, decreased hearing, chest pain, dyspnea on exertion, prolonged cough, abdominal pain, suspicious skin lesions,  difficulty walking, depression, and enlarged lymph nodes.    Physical Exam  General:  Well-developed,well-nourished,in no acute distress; alert,appropriate and cooperative throughout examination Eyes:  pupils equal, pupils round, and corneas and lenses clear.   Lungs:  normal respiratory effort.   Heart:  normal rate and regular rhythm.   Neurologic:  MMSE 1) day, 0k, date - no, 2011 2) Content -  news -pres - ok, gov- no,  senators - no; news - no 3) 5 fwd - ok;  5 rev - 1 error; world --ok 4) serial 7's - 1 error; change making - ok; nickles-ok 5) 3 words- 0/3 6) naming - good, fairly fluent 7) parables - difficulty with new parables: ok with known expression 8) judgement - ok 9)Clockface - rapid and fluent performance  Diificulty with instructions. Most notable: repetition of questions: 4+ times he asked about his performance; 4+ times he asked about side effecgts of medication.    Impression & Recommendations:  Problem # 1:  DEMENTIA (ICD-294.8) Trevor Huffman with poor short term memory but good cognitive function especially with math, calaculations. He did not do well with parables. He repeats questions. Lack insight into his condition. There seems to be frontal lobe issues with insight, understnading and emotionality. He refuses to discuss this with his wife or son,  who is a Librarian, academic. He does demonstrate some mild paranoia on the subject of memory loss. thinking that his wife has put him p for this.  Plan - encouraged him to take Namenda on the starting taper as prescribed with the plan to reach a dose of 10mg  two times a day. It is very likely that he won't comply.  Complete Medication List: 1)  Vytorin 10-40 Mg Tabs (Ezetimibe-simvastatin) .... Take 1 tablet by mouth once a day 2)  Adult Aspirin Low Strength 81 Mg Tbdp (Aspirin) .... Take 1 tablet by mouth once a day 3)  Ramipril 5 Mg Caps (Ramipril) .... Take one capsule by mouth daily 4)  Metoprolol Succinate 50 Mg Tb24 (Metoprolol  succinate) .... Take 1 tablet by mouth once a day 5)  Famotidine 20 Mg Tabs (Famotidine) .Marland Kitchen.. 1 by mouth two times a day 6)  Glucosamine-chondroitin Caps (Glucosamine-chondroit-vit c-mn) .... Take one tablet three times a day 7)  Mylanta 200-200-20 Mg/48ml Susp (Alum & mag hydroxide-simeth) .... 30 cc as needed 8)  Namenda 5 Mg Tabs (Memantine hcl) .Marland Kitchen.. 1 once daily x 7, 1 two times a day x 7, 2 am with 1 pm x 7, then 2 tabs two times a day.

## 2010-09-08 NOTE — Miscellaneous (Signed)
Summary: BONE DENSITY  Clinical Lists Changes  Orders: Added new Test order of T-Bone Densitometry (77080) - Signed Added new Test order of T-Lumbar Vertebral Assessment (77082) - Signed 

## 2010-09-08 NOTE — Letter (Signed)
    [facility] [address] [place], [place]  [address] Phone: [phone]      June 08, 2010   Dakota Korpela [address] [place], [place] [address]  RE:  LAB RESULTS  Dear  Mr. Bogdan,  The following is an interpretation of your most recent lab tests.  Please take note of any instructions provided or changes to medications that have resulted from your lab work.   Bone density study reveals normal spine and moderate osteopenia of the hips.  For maintainence of bone health you need to have at least 1200mg  of calcium intake daily - a combination of diet and supplement; [phone] international units of Vitamin D and daily weight bearing exercise, i.e. walking.  Please come see me if you have any questions about these lab results.   Sincerely Yours,    Jacques Navy MD

## 2010-09-08 NOTE — Assessment & Plan Note (Signed)
Summary: follow up per pt-lb   Vital Signs:  Patient profile:   77 year old male Height:      67 inches Weight:      180 pounds BMI:     28.29 O2 Sat:      96 % on Room air Temp:     97.6 degrees F oral Pulse rate:   69 / minute BP sitting:   116 / 64  (left arm) Cuff size:   regular  Vitals Entered By: Bill Salinas CMA (February 23, 2010 3:20 PM)  O2 Flow:  Room air CC: follow-up visit/ab   Primary Care Provider:  Keyundra Fant  CC:  follow-up visit/ab.  History of Present Illness: He is having burning in the esopahagus when he eats. He is on pepcid 20mg  two times a day.  He c/o a new burning on the bottom of his feet with walking. He denies nocturnal burning. Nor does the pain limit his acitivity.  He has a trigger finger - 3rd finger right hand.   Current Medications (verified): 1)  Vytorin 10-40 Mg  Tabs (Ezetimibe-Simvastatin) .... Take 1 Tablet By Mouth Once A Day 2)  Adult Aspirin Low Strength 81 Mg  Tbdp (Aspirin) .... Take 1 Tablet By Mouth Once A Day 3)  Ramipril 5 Mg Caps (Ramipril) .... Take One Capsule By Mouth Daily 4)  Metoprolol Succinate 50 Mg  Tb24 (Metoprolol Succinate) .... Take 1 Tablet By Mouth Once A Day 5)  Famotidine 20 Mg Tabs (Famotidine) .Marland Kitchen.. 1 By Mouth Two Times A Day 6)  Glucosamine-Chondroitin  Caps (Glucosamine-Chondroit-Vit C-Mn) .... Take One Tablet Three Times A Day  Allergies (verified): No Known Drug Allergies  Past History:  Past Medical History: Last updated: 12/29/2009  CORONARY ARTERY DISEASE (ICD-414.00) HYPERTENSION (ICD-401.9) ENCOUNTER FOR LONG-TERM USE OF OTHER MEDICATIONS (ICD-V58.69) DEMENTIA (ICD-294.8) SPRAIN&STRAIN OTHER SPECIFIED SITES KNEE&LEG (ICD-844.8) LIPOMA (ICD-214.9) Hx of TRIGGER FINGER (ICD-727.03)-s/p multiple injections DEGENERATIVE DISC DISEASE, LUMBOSACRAL SPINE (ICD-722.52)- MRI March '04, recent flare with radicular         symptoms May '09 OTHER AND UNSPECIFIED HYPERLIPIDEMIA (ICD-272.4) Hx of HEADACHE,  CLUSTER (ICD-339.00) Hx of NEPHROLITHIASIS (ICD-592.0) HYPERTROPHY PROSTATE W/O UR OBST & OTH LUTS (ICD-600.00) GERD (ICD-530.81) ABDOMINAL AORTIC ANEURYSM REPAIR, HX OF (ICD-V15.1)  Physician Roster:               GI - Dr. Kinnie Scales               Card - Dr. Juanda Chance               Allergy - Dr. Ilean China - Dr. Londell Moh               Urology - Dr. Patsi Sears               ENT- Dr. Dorma Russell               Neurology - Dr. Sandria Manly               Neurosurgery - Dr. Margarite Gouge - Dr. Thurston Hole               Hand surgery - Dr. Teressa Senter  Physician roster - [facility]             cardiology - Dr. Virgina Evener [address], [place] [address]             IM - Dr. Judithann Sauger, [email or web address], (c) [phone]             Vascular surg - Dr. Felipa Eth, [facility],                                        [place], [place]. [address] [place], [place] [address],              Neurology - Renda Rolls, [address] [place]  [address], [phone]  Past Surgical History: Last updated: 01/02/2008 Nephrotomy for stone - left '79 Inguinal herniorrhaphy right '97 Endograft repair AAA Oct '00 CABG w/ LIMA-LAD, SVG to PDA, diagonal  1 & 2  '00 PCI with stent Spring '09 Prostatectomy, suprapubic for BPH with BOO July '05 Ventral Hernia repair umbilicus to suprapubic area '05 PSH reviewed for relevance, FH reviewed for relevance  Review of Systems  The patient denies anorexia, fever, weight loss, weight gain, chest pain, syncope, dyspnea on exertion, hemoptysis, abdominal pain, hematochezia, muscle weakness, difficulty walking, depression, and angioedema.    Physical Exam  General:  Very tanned elderly white male in no distress Head:  normocephalic and atraumatic.   Eyes:  vision grossly intact.   Lungs:  normal respiratory effort and normal breath sounds.   Heart:  normal rate and regular rhythm.   Msk:  normal  ROM and no joint tenderness.  Some swelling on the palm of the right hand at the base of the 3rd finger.    Neurologic:  Feet with normal sensation, no lesions.   Impression & Recommendations:  Problem # 1:  PERIPHERAL NEUROPATHY (ICD-356.9) Suspect foot pain is related to peripheral neuropathy. He does get relief with otc orthotics  Plan - r/o metabolic cause of pain           use orthotics Orders: TLB-B12 + Folate Pnl (82746_82607-B12/FOL) TLB-TSH (Thyroid Stimulating Hormone) (84443-TSH)  Addendum - B12 and thyroid function normal  Problem # 2:  HYPERLIPIDEMIA-MIXED (ICD-272.4) Due for lab with recommendations to follow  His updated medication list for this problem includes:    Vytorin 10-40 Mg Tabs (Ezetimibe-simvastatin) .Marland Kitchen... Take 1 tablet by mouth once a day  Orders: TLB-Lipid Panel (80061-LIPID) TLB-Hepatic/Liver Function Pnl (80076-HEPATIC)  Addendum - excellent control of cholesterol. Plan is to continue present medications.   Problem # 3:  HYPERTENSION (ICD-401.9)  His updated medication list for this problem includes:    Ramipril 5 Mg Caps (Ramipril) .Marland Kitchen... Take one capsule by mouth daily    Metoprolol Succinate 50 Mg Tb24 (Metoprolol succinate) .Marland Kitchen... Take 1 tablet by mouth once a day  Orders: TLB-BMP (Basic Metabolic Panel-BMET) (80048-METABOL)  BP today: 116/64 Prior BP: 134/70 (01/15/2010)  Good control. Labs are normal  Problem # 4:  Hx of TRIGGER FINGER (ICD-727.03) Patient will make his own appointment with Dr. sypher  Complete Medication List: 1)  Vytorin 10-40 Mg Tabs (Ezetimibe-simvastatin) .... Take 1 tablet by mouth once a day 2)  Adult Aspirin Low Strength 81 Mg Tbdp (Aspirin) .... Take 1 tablet by mouth once a day 3)  Ramipril 5 Mg Caps (Ramipril) .... Take one capsule by mouth daily 4)  Metoprolol Succinate 50 Mg Tb24 (Metoprolol succinate) .... Take 1 tablet by mouth once a day 5)  Famotidine 20 Mg Tabs (Famotidine) .Marland KitchenMarland KitchenMarland Kitchen  1 by mouth two  times a day 6)  Glucosamine-chondroitin Caps (Glucosamine-chondroit-vit c-mn) .... Take one tablet three times a day 7)  Mylanta 200-200-20 Mg/96ml Susp (Alum & mag hydroxide-simeth) .... 30 cc as needed  IS [place]row Note: All result statuses are Final unless otherwise noted.  Tests: (1) B12 + Folate Panel (B12/FOL)   Vitamin B12               874 pg/mL                   211-911   Folate                    >20.0 ng/mL     Deficient  0.4 - 3.4 ng/mL     Indeterminate  3.4 - 5.4 ng/mL     Normal  >5.4 ng/mL  Tests: (2) TSH (TSH)   FastTSH                   0.59 uIU/mL                 0.35-5.50  Tests: (3) BMP (METABOL)   Sodium                    144 mEq/L                   135-145   Potassium                 4.6 mEq/L                   3.5-5.1   Chloride                  110 mEq/L                   96-112   Carbon Dioxide            32 mEq/L                    19-32   Glucose                   88 mg/dL                    16-10   BUN                       19 mg/dL                    9-60   Creatinine                0.8 mg/dL                   4.5-4.0   Calcium                   9.9 mg/dL                   9.8-11.9   GFR                       96.64 mL/min                >60  Tests: (4) Lipid Panel (LIPID)   Cholesterol               138 mg/dL  0-200     ATP III Classification            Desirable:  < 200 mg/dL                    Borderline High:  200 - 239 mg/dL               High:  > = 240 mg/dL   Triglycerides             118.0 mg/dL                 9.1-478.2     Normal:  <150 mg/dL     Borderline High:  956 - 199 mg/dL   HDL                       21.30 mg/dL                 >86.57   VLDL Cholesterol          23.6 mg/dL                  8.4-69.6   LDL Cholesterol           74 mg/dL                    2-95  CHO/HDL Ratio:  CHD Risk                             3                    Men          Women     1/2 Average Risk     3.4          3.3     Average Risk           5.0          4.4     2X Average Risk          9.6          7.1     3X Average Risk          15.0          11.0                           Tests: (5) Hepatic/Liver Function Panel (HEPATIC)   Total Bilirubin           0.9 mg/dL                   2.8-4.1   Direct Bilirubin          0.2 mg/dL                   3.2-4.4   Alkaline Phosphatase      65 U/L                      39-117   AST                       26 U/L                      0-37   ALT  21 U/L                      0-53   Total Protein             6.8 g/dL                    1.6-1.0   Albumin                   4.4 g/dL                    9.6-0.4   Patient Instructions: 1)  Foot discomfort - a peripheral neuropathy that is now causing problems. Plan - check B12, TSH. use the inserts. 2)  Cholesterol - will check labs today 3)  Blood pressure - great control. Will check routine lab 4)  Acid reflux- continue with the famotodine (Pepcid); for acute discomfort take Mylanta as needed.

## 2010-09-08 NOTE — Progress Notes (Signed)
Summary: labs  ---- Converted from flag ---- ---- 12/18/2009 2:28 PM, Sherri Rad, RN, BSN wrote: needs L/L/cbc/bmet ------------------------------  Phone Note Outgoing Call   Call placed by: Sherri Rad, RN, BSN,  Dec 23, 2009 11:54 AM Call placed to: Patient Summary of Call: I called and left a message for the pt to remind him to come for labs. The pt was supposed to come on 12/18/09. Initial call taken by: Sherri Rad, RN, BSN,  Dec 23, 2009 11:54 AM

## 2010-09-08 NOTE — Assessment & Plan Note (Signed)
Summary: NOT FEELING WELL,CD   Vital Signs:  Patient profile:   77 year old male Height:      67 inches Weight:      178 pounds BMI:     27.98 O2 Sat:      97 % on Room air Temp:     97.3 degrees F oral Pulse rate:   53 / minute BP sitting:   130 / 84  (left arm) Cuff size:   regular  Vitals Entered By: Bill Salinas CMA (Dec 29, 2009 9:41 AM)  O2 Flow:  Room air CC: pt here with c/o "funny feeling" in his feet/ ab   Primary Care Provider:  [facility]  CC:  pt here with c/o "funny feeling" in his feet/ ab.  History of Present Illness: Patient is here with concern about a "funny feeling" in his feet.  He describes a tingling sensation on the bottom of both feet.  He denies pain or feeling of numbness.  He reports no changes in his balance and says he is still able to play golf.    Current Medications (verified): 1)  Vytorin 10-40 Mg  Tabs (Ezetimibe-Simvastatin) .... Take 1 Tablet By Mouth Once A Day 2)  Adult Aspirin Low Strength 81 Mg  Tbdp (Aspirin) .... Take 1 Tablet By Mouth Once A Day 3)  Ramipril 10 Mg  Caps (Ramipril) .... Take 1 Tablet By Mouth Once A Day 4)  Metoprolol Succinate 50 Mg  Tb24 (Metoprolol Succinate) .... Take 1 Tablet By Mouth Once A Day 5)  Famotidine 20 Mg Tabs (Famotidine) .Marland Kitchen.. 1 By Mouth Two Times A Day 6)  Glucosamine-Chondroitin  Caps (Glucosamine-Chondroit-Vit C-Mn) .... 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  Risk Factors: Smoking Status: quit (2008-01-09)  Past Medical History:  CORONARY ARTERY DISEASE (ICD-414.00) HYPERTENSION (ICD-401.9) ENCOUNTER FOR LONG-TERM USE OF OTHER MEDICATIONS (ICD-V58.69) DEMENTIA (ICD-294.8) SPRAIN&STRAIN OTHER SPECIFIED SITES KNEE&LEG (ICD-844.8) LIPOMA (ICD-214.9) Hx of TRIGGER FINGER (ICD-727.03)-s/p multiple injections DEGENERATIVE DISC DISEASE, LUMBOSACRAL SPINE (ICD-722.52)- MRI March '04, recent flare with radicular         symptoms May '09 OTHER AND UNSPECIFIED HYPERLIPIDEMIA (ICD-272.4) Hx of HEADACHE, CLUSTER (ICD-339.00) Hx of NEPHROLITHIASIS (ICD-592.0) HYPERTROPHY PROSTATE W/O UR OBST & OTH LUTS (ICD-600.00) GERD (ICD-530.81) ABDOMINAL AORTIC ANEURYSM REPAIR, HX OF (ICD-V15.1)  Physician Roster:               GI - Dr. Kinnie Scales               Card - Dr. Juanda Chance               Allergy - Dr. Ilean China - Dr. Londell Moh               Urology - Dr. Patsi Sears  ENT- Dr. Dorma Russell               Neurology - Dr. Sandria Manly               Neurosurgery - Dr. Margarite Gouge - Dr. Thurston Hole               Hand surgery - Dr. Teressa Senter  Physician roster - [facility]             cardiology - Dr. Virgina Evener [address], [place] [address]             IM - Dr. Judithann Sauger, [email or web address], (c) [phone]             Vascular surg - Dr. Felipa Eth, [facility],                                        [place], [place]. [address], [place], [place] [address],              Neurology - Lorin Como, [address], [place]  [address], [phone]  Review of Systems  The patient denies anorexia, fever, weight loss, weight gain, chest pain, syncope, dyspnea on exertion, peripheral edema, headaches, abdominal pain, muscle weakness, difficulty walking, and unusual  weight change.    Physical Exam  General:  alert and well-nourished.   Head:  normocephalic and atraumatic.   Eyes:  vision grossly intact and no injection.   Ears:  no external deformities.   Nose:  no external deformity.   Neck:  supple and no masses.   Lungs:  normal respiratory effort, no intercostal retractions, no accessory muscle use, normal breath sounds, no crackles, and no wheezes.   Heart:  normal rate, regular rhythm, no murmur, no gallop, and no rub.   Abdomen:  soft and non-tender.   Msk:  Bilateral feet with normal ROM, no erythema or tenderness.    Pulses:  Right foot:  posterior tibial 1+, absent dorsalis pedis Left foot:  absent posterior tibial, dorsalis pedis 1+ capillary refill delayed in lower extremities. Extremities:  no pedal edema. Neurologic:  alert, oriented x3.  Intact vibratory sensation to both feet.  Intact fine touch sensation to both feet.  Romberg negative.   Psych:  Normally interactive with recent and remote history.  Patient repeated several statements/questions during the interview.     Impression & Recommendations:  Problem # 1:  PERIPHERAL NEUROPATHY (ICD-356.9) Patient's description of tingling foot sensations is suspicious for peripheral neuropathy.  He does have significant risk factors for peripheral vascular disease and poor pulses to his feet.    Plan- Lower extremity arterial doppler to rule out peripheral vascular disease as a cause.          B12 and TSH          Consider treatment for either peripheral vascular disease or peripheral neuropathy pending outcome of tests.   Orders: TLB-TSH (Thyroid Stimulating Hormone) (84443-TSH) TLB-B12 + Folate Pnl (52841_[id number]-N02/VOZ) LE Arterial Doppler/ABI (Le arterial doppler)  Addendum - TSH and B12 normal Tests: (1) TSH (TSH)   FastTSH                   0.60 uIU/mL  0.35-5.50  Tests: (2) B12 + Folate Panel (B12/FOL)   Vitamin B12               730 pg/mL                    211-911  Complete Medication List: 1)  Vytorin 10-40 Mg Tabs (Ezetimibe-simvastatin) .... Take 1 tablet by mouth once a day 2)  Adult Aspirin Low Strength 81 Mg Tbdp (Aspirin) .... Take 1 tablet by mouth once a day 3)  Ramipril 10 Mg Caps (Ramipril) .... Take 1 tablet by mouth once a day 4)  Metoprolol Succinate 50 Mg Tb24 (Metoprolol succinate) .... Take 1 tablet by mouth once a day 5)  Famotidine 20 Mg Tabs (Famotidine) .Marland Kitchen.. 1 by mouth two times a day 6)  Glucosamine-chondroitin Caps (Glucosamine-chondroit-vit c-mn) .... Take one tablet three times a day

## 2010-09-08 NOTE — Progress Notes (Signed)
Summary: opinion regarding coming off plavix  Phone Note Call from Patient Call back at Home Phone [phone] Call back at [phone]   Caller: Patient Reason for Call: Talk to Nurse Details for Reason: Per pt calling, Pt in [place] now, md there wants pt to come off plavix and take 2 baby asa 81 mg. want to get his opinion.  Initial call taken by: Lorne Skeens,  September 26, 2009 9:14 AM  Follow-up for Phone Call        Pt aware OK to stop Plavix - in note on 05/06/09.  Wants to know if he should take ASA 81 mg twicea day as recommended by MD there in [facility].  Will ask Dr Juanda Chance and call pt back with orders Follow-up by: Charolotte Capuchin, RN,  September 26, 2009 9:29 AM  Additional Follow-up for Phone Call Additional follow up Details #1::        ( late entry) Dr. Juanda Chance attempted to call the pt back last night about 6:10pm regarding ASA and plavix. Per Dr. Juanda Chance, he recommends that CABG pt's remain on long term plavix and an ASA 81mg  once daily. He left a message that we will all back. Sherri Rad, RN, BSN  October 02, 2009 10:12 AM  Dr. Juanda Chance called and spoke with Mr. Rahmani, since he has had a lot of bruising with plavix, Dr. Juanda Chance has given the pt the ok to hold plavix and continue with baby asa. Additional Follow-up by: Sherri Rad, RN, BSN,  October 03, 2009 5:34 PM

## 2010-09-08 NOTE — Letter (Signed)
[facility]: [facility]   Imported By: Lester [place] 03/09/2010 09:38:50  _____________________________________________________________________  External Attachment:    Type:   Image     Comment:   External Document

## 2010-09-08 NOTE — Letter (Signed)
Summary: [facility] / [place]   [facility]    Imported By: Lennie Odor 04/21/2010 10:59:34  _____________________________________________________________________  External Attachment:    Type:   Image     Comment:   External Document

## 2010-09-08 NOTE — Assessment & Plan Note (Signed)
Summary: PT WOULD NOT GIVE REASON FOR VISIT--STC   Vital Signs:  Patient profile:   77 year old male Height:      67 inches Weight:      180 pounds BMI:     28.29 O2 Sat:      98 % on Room air Temp:     97.5 degrees F oral Pulse rate:   64 / minute BP sitting:   114 / 68  (left arm) Cuff size:   regular  Vitals Entered By: Bill Salinas CMA (April 09, 2010 1:07 PM)  O2 Flow:  Room air   Primary Care Nadalee Neiswender:  Norins   History of Present Illness: Patient presents for a routine medical evaluation. He does report that he is bothered by acid reflux. He had previously been prescribed famotodine 20mg  daily which he hasn't been taking. He c/o mild change in sensation in the bottoms of his feet. He does not recall being previously diagnosed with peripheral neuropathy.   Patient had a full exam in July including full labs - he does not recall visit or labs or receiving a copy of that report.   He has seen Dr. sypher for a tendon injection for trigger finger with good results and restoration of his golf grip.   Current Medications (verified): 1)  Vytorin 10-40 Mg  Tabs (Ezetimibe-Simvastatin) .... Take 1 Tablet By Mouth Once A Day 2)  Adult Aspirin Low Strength 81 Mg  Tbdp (Aspirin) .... Take 1 Tablet By Mouth Once A Day 3)  Ramipril 5 Mg Caps (Ramipril) .... Take One Capsule By Mouth Daily 4)  Metoprolol Succinate 50 Mg  Tb24 (Metoprolol Succinate) .... Take 1 Tablet By Mouth Once A Day 5)  Famotidine 20 Mg Tabs (Famotidine) .Marland Kitchen.. 1 By Mouth Two Times A Day 6)  Glucosamine-Chondroitin  Caps (Glucosamine-Chondroit-Vit C-Mn) .... Take One Tablet Three Times A Day 7)  Mylanta 200-200-20 Mg/66ml Susp (Alum & Mag Hydroxide-Simeth) .... 30 Cc As Needed  Allergies (verified): No Known Drug Allergies PMH-FH-SH reviewed-no changes except otherwise noted  Review of Systems  The patient denies anorexia, fever, weight loss, weight gain, decreased hearing, chest pain, dyspnea on exertion,  peripheral edema, headaches, abdominal pain, severe indigestion/heartburn, hematuria, genital sores, suspicious skin lesions, difficulty walking, unusual weight change, enlarged lymph nodes, angioedema, and breast masses.    Physical Exam  General:  WNWD white male in NAD Head:  normocephalic and atraumatic.   Eyes:  vision grossly intact, pupils equal, pupils round, and corneas and lenses clear.   Ears:  R ear normal and L ear normal.   Nose:  no external deformity and no external erythema.   Mouth:  Oral mucosa and oropharynx without lesions or exudates.  Teeth in good repair. Neck:  supple and full ROM.   Chest Wall:  no deformities.  well healed sternotomy scar Lungs:  normal respiratory effort and normal breath sounds.   Heart:  normal rate, regular rhythm, no gallop, and no JVD.   Abdomen:  soft, non-tender, normal bowel sounds, no guarding, no rigidity, and no hepatomegaly.   Prostate:  deferred to Dr. Patsi Sears and recent exam Msk:  Normal grip and hand function. no joint tenderness, no joint swelling, and no joint warmth.   Pulses:  2+ radial Extremities:  No clubbing, cyanosis, edema, or deformity noted with normal full range of motion of all joints.   Neurologic:  alert & oriented X3, cranial nerves II-XII intact, and gait normal.   Skin:  turgor normal.  Very tanned. No suspicious lesions Cervical Nodes:  no anterior cervical adenopathy and no posterior cervical adenopathy.   Psych:  Oriented to person, place, family. Poor memory: recalling last (recent) exam. Formal MMSE not done.    Impression & Recommendations:  Problem # 1:  PVD (ICD-443.9) Weak pulses but good skin color and absent any vascular lesions.  Problem # 2:  PERIPHERAL NEUROPATHY (ICD-356.9) Previously evaluated at July '11 visit - normal B12 and thyroid function. He is having no pain and does not require any treatment.  Problem # 3:  HYPERLIPIDEMIA-MIXED (ICD-272.4) Lab in July '11 revealed good  control.  Plan - continue present medications.  His updated medication list for this problem includes:    Vytorin 10-40 Mg Tabs (Ezetimibe-simvastatin) .Marland Kitchen... Take 1 tablet by mouth once a day  Problem # 4:  HYPERTENSION (ICD-401.9)  His updated medication list for this problem includes:    Ramipril 5 Mg Caps (Ramipril) .Marland Kitchen... Take one capsule by mouth daily    Metoprolol Succinate 50 Mg Tb24 (Metoprolol succinate) .Marland Kitchen... Take 1 tablet by mouth once a day  BP today: 114/68 Prior BP: 116/64 (02/23/2010)  Labs Reviewed: K+: 4.6 (02/23/2010) Creat: : 0.8 (02/23/2010)    Good control with normal recent labs. will continue present medications.  Problem # 5:  DEMENTIA (ICD-294.8) Mr. Thau remains highly functional and able to manage most of his personal ADLs. He remains active and golfs actively. His greatest problem seems to be memory, i.e. does not recall recent evaluation as an example.   Plan - will start Namenda 5mg  once daily x 7, 5mg  two times a day x 7, 10mg  AM and 5 mg PM x 7, then 10mg  two times a day.  Problem # 6:  GERD (ICD-530.81) c/o inermittent symptoms  Plan - he is to take Famotidine 20mg  two times a day as needed.  His updated medication list for this problem includes:    Famotidine 20 Mg Tabs (Famotidine) .Marland Kitchen... 1 by mouth two times a day    Mylanta 200-200-20 Mg/36ml Susp (Alum & mag hydroxide-simeth) .Marland KitchenMarland KitchenMarland KitchenMarland Kitchen 30 cc as needed  Complete Medication List: 1)  Vytorin 10-40 Mg Tabs (Ezetimibe-simvastatin) .... Take 1 tablet by mouth once a day 2)  Adult Aspirin Low Strength 81 Mg Tbdp (Aspirin) .... Take 1 tablet by mouth once a day 3)  Ramipril 5 Mg Caps (Ramipril) .... Take one capsule by mouth daily 4)  Metoprolol Succinate 50 Mg Tb24 (Metoprolol succinate) .... Take 1 tablet by mouth once a day 5)  Famotidine 20 Mg Tabs (Famotidine) .Marland Kitchen.. 1 by mouth two times a day 6)  Glucosamine-chondroitin Caps (Glucosamine-chondroit-vit c-mn) .... Take one tablet three times a  day 7)  Mylanta 200-200-20 Mg/60ml Susp (Alum & mag hydroxide-simeth) .... 30 cc as needed 8)  Namenda 5 Mg Tabs (Memantine hcl) .Marland Kitchen.. 1 once daily x 7, 1 two times a day x 7, 2 am with 1 pm x 7, then 2 tabs two times a day.  Other Orders: Flu Vaccine 14yrs + (91478) Administration Flu vaccine - MCR (G9562) Prescriptions: NAMENDA 5 MG TABS (MEMANTINE HCL) 1 once daily x 7, 1 two times a day x 7, 2 AM with 1 PM x 7, then 2 tabs two times a day.  #70 x 0   Entered and Authorized by:   Jacques Navy MD   Signed by:   Jacques Navy MD on 04/12/2010   Method used:   Electronically to  [facility] [address] [phone]* (retail)       [address]       [place], [place]  [address]       Ph: [phone]       Fax: [phone]   RxID:   [id number]       Flu Vaccine Consent Questions     Do you have a history of severe allergic reactions to this vaccine? no    Any prior history of allergic reactions to egg and/or gelatin? no    Do you have a sensitivity to the preservative Thimersol? no    Do you have a past history of Guillan-Barre Syndrome? no    Do you currently have an acute febrile illness? no    Have you ever had a severe reaction to latex? no    Vaccine information given and explained to patient? yes    Are you currently pregnant? no    Lot Number:[id number]   Exp Date:02/06/2011   Site Given  Left Deltoid IMflu

## 2010-11-17 LAB — URINE CULTURE: Culture: NO GROWTH

## 2010-11-17 LAB — CBC
HCT: 38.1 % — ABNORMAL LOW (ref 39.0–52.0)
HCT: 50.2 % (ref 39.0–52.0)
Hemoglobin: 16.9 g/dL (ref 13.0–17.0)
MCHC: 33.7 g/dL (ref 30.0–36.0)
RBC: 5.61 MIL/uL (ref 4.22–5.81)
WBC: 7.1 10*3/uL (ref 4.0–10.5)
WBC: 7.6 10*3/uL (ref 4.0–10.5)

## 2010-11-17 LAB — BASIC METABOLIC PANEL
BUN: 13 mg/dL (ref 6–23)
CO2: 23 mEq/L (ref 19–32)
Chloride: 110 mEq/L (ref 96–112)
Creatinine, Ser: 0.7 mg/dL (ref 0.4–1.5)
GFR calc Af Amer: >60 mL/min (ref >60)
Glucose, Bld: 117 mg/dL — ABNORMAL HIGH (ref 70–99)
Potassium: 3.8 mEq/L (ref 3.5–5.1)

## 2010-11-17 LAB — URINALYSIS, ROUTINE W REFLEX MICROSCOPIC
Hgb urine dipstick: NEGATIVE
Ketones, ur: NEGATIVE mg/dL
Protein, ur: NEGATIVE mg/dL
Urobilinogen, UA: 0.2 mg/dL (ref 0.0–1.0)
pH: 6.5 (ref 5.0–8.0)

## 2010-11-17 LAB — COMPREHENSIVE METABOLIC PANEL
AST: 26 U/L (ref 0–37)
Alkaline Phosphatase: 66 U/L (ref 39–117)
CO2: 30 mEq/L (ref 19–32)
Calcium: 10.1 mg/dL (ref 8.4–10.5)
Chloride: 104 mEq/L (ref 96–112)
Potassium: 5.2 mEq/L — ABNORMAL HIGH (ref 3.5–5.1)
Sodium: 140 mEq/L (ref 135–145)
Total Protein: 6.8 g/dL (ref 6.0–8.3)

## 2010-11-17 LAB — DIFFERENTIAL
Lymphs Abs: 1.1 10*3/uL (ref 0.7–4.0)
Monocytes Relative: 7 % (ref 3–12)

## 2010-11-17 LAB — PROTIME-INR: INR: 1 (ref 0.00–1.49)

## 2010-12-18 ENCOUNTER — Other Ambulatory Visit: Payer: Self-pay | Admitting: Sports Medicine

## 2010-12-18 DIAGNOSIS — M545 Low back pain: Secondary | ICD-10-CM

## 2010-12-20 ENCOUNTER — Ambulatory Visit
Admission: RE | Admit: 2010-12-20 | Discharge: 2010-12-20 | Disposition: A | Discharge status: Home / Self Care | Payer: Medicare Other | Source: Ambulatory Visit | Attending: Sports Medicine | Admitting: Sports Medicine

## 2010-12-20 DIAGNOSIS — M545 Low back pain: Secondary | ICD-10-CM

## 2010-12-21 ENCOUNTER — Encounter: Payer: Self-pay | Admitting: Internal Medicine

## 2010-12-22 NOTE — Assessment & Plan Note (Signed)
[facility]                            CARDIOLOGY OFFICE NOTE   ARLINGTON, SIGMUND                          MRN:          [id number]  DATE:05/22/2008                            DOB:          1930/01/07    PRIMARY CARE PHYSICIAN:  Rosalyn Gess. Norins, MD   CARDIOLOGIST:  Scherry Ran, MD, cardiologist in [place].   CLINICAL HISTORY:  Trevor Huffman is 77 years old and came in today for an  unscheduled visit because of pain and ecchymoses in his right thigh.  He  has had pain in both legs related to lumbar spine disk disease.  He saw  Dr. Sandria Manly who referred him to Radiology for an injection which was down  on the left side about 5 days ago.  Yesterday, he developed and noted  some ecchymoses in his right thigh and he had some persistent pain which  really had not changed from his previous pain in that leg.  He was seen  in [place] and his blood pressures were elevated but they did not  find any serious problem in his right leg.  He called him today and we  brought him in to evaluate him.   He has coronary artery disease and had previous bypass surgery by Dr.  Laneta Simmers in 2000.  About 6 months ago, he had 2 bare-metal stents placed  in the vein graft to diagonal branch of the LAD.  His other grafts were  patent.  He has not had any recent cardiac symptoms.   PAST MEDICAL HISTORY:  Significant for abdominal aneurysm repair with a  stent graft by Dr. Felipa Eth.  This was done in 2000.  He has had  serial PC scans in [place] since then.  Past history is also significant  for hyperlipidemia, GERD, and nephrolithiasis.   CURRENT MEDICATIONS:  1. Toprol-XL 50 mg 1/2 tablet daily.  2. Altace 10 mg daily.  3. Nexium.  4. Vytorin.  5. Aspirin.  6. Plavix.  His Plavix was stopped 5 days before his injection and he      resumed it 2 days ago.   PHYSICAL EXAMINATION:  VITAL SIGNS:  Today, his blood pressure was  146/83 and pulse 63 and regular (he was told  blood pressure was elevated  in the ED last night, but I do not have those records yet).  NECK:  There was no venous distension.  The carotid pulses were full  without bruits.  CHEST:  Clear.  CARDIAC:  Rhythm was regular.  No murmurs or gallops.  ABDOMEN:  Soft with normal bowel sounds.  EXTREMITIES:  The peripheral pulses were full.  There is no peripheral  edema.  The ankle jerks and knee jerks were symmetrical.  He has some  ecchymoses in the right medial thigh, but on exam the thigh was soft and  there was no palpable hematoma.  I could feel no cord in the extremities  and there was at most trace edema in the lower extremities.   IMPRESSION:  1. Ecchymoses right thigh probably related to minor trauma plus  Plavix, rule out evidence of deep vein thrombosis.  2. Coronary artery disease status post coronary bypass graft surgery      in 2000 and status post stenting of the vein graft to diagonal      branch of the left anterior descending 6 months ago with 2 bare-      metal stents.  3. Good left ventricular function.  4. Status post stent graft for abdominal aortic aneurysm.  5. Gastroesophageal reflux disease.  6. Hyperlipidemia.   RECOMMENDATIONS:  We will plan to get venous Dopplers to rule out  phlebitis, although I think this is not likely.  He is past 6 months  with bare-metal stent, so I think he can come off Plavix at least for  the short-term and will leave off Plavix until he has second spine  injection in about 5-7 days.  I will see him back as needed after that.     Bruce Elvera Lennox Juanda Chance, MD, [facility]  Electronically Signed    BRB/MedQ  DD: 05/22/2008  DT: 05/23/2008  Job #: [id number]

## 2010-12-22 NOTE — Op Note (Signed)
Trevor Huffman, Trevor Huffman                 ACCOUNT NO.:  [id number]   MEDICAL RECORD NO.:  [id number]          PATIENT TYPE:  AMB   LOCATION:  DSC                          FACILITY:  [facility]   PHYSICIAN:  Cindee Salt, M.D.       DATE OF BIRTH:  09/22/1929   DATE OF PROCEDURE:  04/19/2008  DATE OF DISCHARGE:                               OPERATIVE REPORT   PREOPERATIVE DIAGNOSIS:  Stenosing tenosynovitis of left middle finger  with flexor sheath cyst.   POSTOPERATIVE DIAGNOSIS:  Stenosing tenosynovitis of left middle finger  with flexor sheath cyst.   OPERATION:  Excision of flexor sheath cyst with release of A1 pulley,  left middle finger.   SURGEON:  Cindee Salt, MD   ASSISTANT:  Carolyne Fiscal RN   ANESTHESIA:  Forearm-based IV regional.   DATE OF OPERATION:  April 19, 2008.   HISTORY:  The patient is a 77 year old male with a history of triggering  of his left middle finger.  He has developed a cyst on the A1 pulley.  He is admitted for excision of the cyst with release of the A1 pulley,  left middle finger.  He is aware of risks and complications including  infection, recurrence, injury to arteries, nerves, and tendons,  incomplete relief of symptoms, and dystrophy.  In the preoperative area,  the patient is seen.  The extremity marked by both the patient and  surgeon.  Antibiotic given.   PROCEDURE:  The patient was brought the operating room where a forearm-  based IV regional anesthetic was carried out without difficulty.  He was  prepped using DuraPrep, supine position with his left arm free.  A time-  out was taken.  An oblique incision was made over the A1 pulley of the  left middle finger, carried down through subcutaneous tissue.  A flexor  sheath cyst was immediately apparent.  Retractors were placed protecting  neurovascular bundles radially and ulnarly.  The cyst was excised from  the flexor sheath without difficulty and sent to pathology.  The A1  pulley was then released  on its radial aspect.  A small incision made  centrally in the A2 pulley.  Area compression to the tendon was easily  apparent.  No further triggering was noted.  The wound was irrigated.  The skin was closed with interrupted 5-0 Vicryl Rapide sutures.  A  sterile compressive dressing with the fingers free was applied.  The  patient tolerated the procedure well and was taken to recovery room for  observation in satisfactory condition.  He will be discharged home to  return to [facility] in 1 week on Vicodin.           ______________________________  Cindee Salt, M.D.    GK/MEDQ  D:  04/19/2008  T:  04/20/2008  Job:  [id number]   cc:   Rosalyn Gess. Norins, MD

## 2010-12-22 NOTE — Assessment & Plan Note (Signed)
[facility]                            CARDIOLOGY OFFICE NOTE   Trevor Huffman, Trevor Huffman                          MRN:          [id number]  DATE:12/21/2007                            DOB:          1929-10-09    PRIMARY CARE PHYSICIAN:  Rosalyn Gess. Norins, MD, in [place].  Dr. Judithann Sauger in [place].  Phone number [phone].   VASCULAR SURGEON:  Dr. Felipa Eth in [place].  Phone #: [phone].   CARDIOLOGIST:  Dr. Virgina Evener in [place], phone #: [phone],   CLINICAL COURSE:  Trevor Huffman is 77 years old and is seeing me for the  first time for cardiology here in [place] after being followed by Dr.  Glennon Hamilton.  He had bypass surgery by Dr. Evelene Croon in 2000.  He has  done quite well from a cardiac standpoint until recently, when he  developed chest pain in [place] and was hospitalized about six weeks ago,  and had two stents put in.  He does not know the details of the  procedure at present.  He has done quite well since that time.  Has had  no recurrent chest pain, shortness of breath or palpitations.   He also had an abdominal aneurysm repair with a stent graft by Dr. Felipa Eth in 2000, and has been followed with annual CT scans in [place]  since that time.   PAST MEDICAL HISTORY:  Significant for:  1. Hyperlipidemia.  2. GERD.  3. BPH.  4. Nephrolithiasis.  5. Cluster headaches.  6. Degenerative disk disease of the lumbar spine.  He recently had an      MRI of his lumbar spine, which showed protruded disk and is      scheduled to see Dr. Sandria Manly next week.   CURRENT MEDICATIONS:  1. Toprol XL 50 mg 1/2 tablet daily.  2. Baby aspirin.  3. Altace 10 mg daily.  4. Multivitamins.  5. Nexium 40 mg daily.  6. Plavix 75 mg daily.  7. Vytorin 10/40 daily.   EXAMINATION:  Blood pressure is 141/78, pulse 70 and regular.  There was  no venous distension.  The carotid pulses were full without bruits.  CHEST:  Clear.  Heart rhythm was  regular.  There were no murmurs or gallops.  ABDOMEN:  Soft with normal bowel sounds.  There is no  hepatosplenomegaly.  Peripheral pulses were full.  There was no peripheral edema.   An electrocardiogram showed sinus rhythm with left axis deviation.   IMPRESSION:  1. Coronary artery disease status post coronary artery bypass graft      surgery in 2000, and status post recent stenting of vein grafts in      [place], now stable.  2. Status post abdominal aortic aneurysm repair with stent grafts.  3. Hyperlipidemia with persistent low HDL.  4. Hypertension.  5. Gastroesophageal reflux disease.  6. Lumbosacral spine disease.   RECOMMENDATIONS:  I think Aram is doing well now after his recent stent  procedure.  We will get the records from Dr. Colen Darling regarding this.  I  will not make any changes in his medications today.  We might consider  adding Niaspan for his low HDL in the future.  We will plan to see him  back in four months. He will be planning to go down to [place] some time  after that.     Bruce Elvera Lennox Juanda Chance, MD, [facility]  Electronically Signed    BRB/MedQ  DD: 12/21/2007  DT: 12/21/2007  Job #: [id number]

## 2010-12-22 NOTE — Assessment & Plan Note (Signed)
[facility]                           PRIMARY CARE OFFICE NOTE   Trevor, Huffman                          MRN:          [id number]  DATE:03/24/2007                            DOB:          [birth year]    [name] is a 77 year old gentleman who I follow for routine medical  care. He was last seen in the office by Dr. Glennon Hamilton on 03/02/2006 at  which time he was thought to be cardiac stable.   The patient resides 7 months out of the year in [place] and is seen by  Dr. Nicholes Mango. He is now in [place]. He wants to maintain an ongoing  primary care relationship here in the city.   CHIEF COMPLAINTS:  The patient is troubled by post-nasal drainage  consistent with allergic rhinitis. He is inquiring as to the safety of  use of Ambien on his current medications. He is enquiring as to the  safety of glucosamine for arthritic pain which for him is mostly low  back.   PAST MEDICAL HISTORY:  1. Usual childhood diseases.  2. Aortic abdominal aneurysm.  3. GERD.  4. Coronary artery disease.  5. BPH.  6. History of nephrolithiasis.  7. Last cluster headache.  8. Hyperlipidemia.  9. Chronic back pain with known degenerative disc disease with last      MRI of the spine being in March 2004 with no need for surgical      intervention revealed.  10.Trigger finger with multiple injections.   SURGICAL HISTORY:  1. Nephrotomy for nephrolithiasis on the left in 1979.  2. Right herniorrhaphy in 1997.  3. Endograft repair of an abdominal aortic aneurysm with an Ancure      graft in October 2000.  4. CABG with lima to LAD and SCG to PDA diagonal, one diagonal two in      2000.  5. Suprapubic prostatectomy in July 2005 for BPH with bladder outlet      obstruction.  6. Ventral hernia repair with mesh from umbilicus to suprapubic area      in 2005.   PHYSICIAN ROSTER:  1. Dr. Glennon Hamilton and Dr. Charlies Constable for cardiology in [place].  2. Dr. Ritta Slot for  GI.  3. Dr. Stevphen Rochester for allergies.  4. Dr. Leta Speller for dermatology.  5. Dr. Jethro Bolus for urology.  6. Dr. Romeo Apple for general surgery.  7. Dr. Lovey Newcomer for ENT.  8. Dr. Meryl Crutch for neurology.  9. Dr. Newell Coral for neurosurgery.  10.Dr. Thurston Hole for orthopedics.  11.Dr. Laroy Apple for hand surgery.   Chart review revealed the patient to have had a colonoscopy in June 2007  with diverticulosis and was otherwise unremarkable. His last  echocardiogram in 2005 with mild mitral regurgitation and mild tricuspid  regurgitation. His last nuclear exercise stress study was in April 2005  and this was a normal study.   Of note, the patient's followed on a regular basis for his AAA Endograft  with non-evasive testing.   CURRENT MEDICATIONS:  1. Toprol XL 50 mg 1/2 tablet daily.  2.  Aspirin 81 mg daily.  3. Altace 10 mg daily.  4. Nexium 40 mg q.a.m.  5. Plavix 75 mg daily.  6. Vytorin 10/40 daily.   FAMILY HISTORY:  Significant for his father having died of lung cancer.  His mother had a massive CVA leaving her comatose for several years in  her nineties.   SOCIAL HISTORY:  The patient is married. 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 He has three  sons, one of whom was my classmate at [employer]. The patient's family is  in good health. He spends his time between [place] and [place].   IMMUNIZATIONS:  The patient has not had pneumonia vaccine per our  records.   HABITS:  The patient is a former smoker. He is a rare user of alcohol.   ALLERGIES:  No known drug allergies.   REVIEW OF SYSTEMS:  The patient has had no fevers, sweats, or chills. No  visual changes. No cardiovascular complaints. No respiratory complaints.  No GI complaints.   EXAMINATION:  VITAL SIGNS:  Temperature 96.8, blood pressure 131/86,  pulse 57, weight 182, height 5 foot 8 inches.  GENERAL:  This is a well nourished trim looking  gentleman who appears  younger than his stated chronologic age.   No physical examination was conducted.   ASSESSMENT AND PLAN:  1. Cardiovascular. The patient is stable status post bypass surgery in      2000. He has been tested on a routine basis with his last nuclear      stress test as noted in 2005 and is in our records as being normal.      He last saw Dr. Corinda Gubler in July 2007 and was thought to be stable.      He is followed on a regular basis for his AAA follow up, as well as      general medicine in [place].  2. GI. The patient is currently stable and his GERD is well controlled      with Nexium. He has not required EGD in several years.  3. The patient is current for colorectal cancer screening with last      study in our records being in June 2007, but there is a notation      that he had a study in June 2008.  4. Lipids. The patient is followed both in [place] and here. His last      labs in [place] on 03/03/2006 revealed excellent control with an      LDL of 71, HDL was 31.9, and total cholesterol was 128. Plan: The      patient is to continue on Vytorin.  5. GU. The patient is status post radical proctectomy for BOOP and      currently is doing very well with no GU complaints.  6. Back pain. The patient has chronic back pain which is actually      markedly improved with a course of physical therapy and regular      flex stress exercise. I have advised him that glucosamine would not      be particularly helpful for his back, although it may be helpful      for knee, hips, and shoulder discomfort. There is no      contraindications for the use of glucosamine.   In summary, this is a very pleasant gentleman. He is medically stable. I  have advised him that it is safe to use Ambien for  sleep on a  transatlantic flight. I have advised him for allergic rhinitis that it  is safe to use over-the-counter non-sedating antihistamines such as  Loratadine or citrazene.   The  patient is asked to return to see me on an as needed basis.     Rosalyn Gess Norins, MD  Electronically Signed    MEN/MedQ  DD: 03/25/2007  DT: 03/26/2007  Job #: [id number]   cc:   Jacqulyn Bath

## 2010-12-22 NOTE — Discharge Summary (Signed)
Trevor, Huffman                 ACCOUNT NO.:  [id number]   MEDICAL RECORD NO.:  [id number]          PATIENT TYPE:  INP   LOCATION:  2027                         FACILITY:  [facility]   PHYSICIAN:  Trevor Huffman, M.D. DATE OF BIRTH:  01-Oct-1929   DATE OF ADMISSION:  12/31/2008  DATE OF DISCHARGE:  01/01/2009                               DISCHARGE SUMMARY   ADMITTING DIAGNOSES:  1. Left shoulder rotator cuff tear.  2. Left shoulder biceps tendon tear.  3. Coronary artery disease.  4. Hypertension.  5. Gastroesophageal reflux disease.  6. History of an abdominal aortic aneurysm.  7. Memory loss that has been completely worked up by Neurology, but is      not Alzheimer's.   DISCHARGE DIAGNOSES:  1. Left shoulder scope, status post rotator cuff repair.  2. Biceps tendon tear.  3. Coronary artery disease, status post stent placement.  4. Hypertension.  5. Gastroesophageal reflux disease.  6. Abdominal aortic aneurysm.  7. Memory loss.   HISTORY OF PRESENT ILLNESS:  The patient is a 77 year old white male  with a history of multiple falls this spring, resulting in the patient  unable to actively raise his left arm.  He does have full active  assistive range of motion with some mild amount of pain.  We have gotten  cardiac clearance from Dr. Charlies Constable as well as from his cardiologist  in [place], [place].  His neurologist in [place] also worked him up  completely this spring for his memory loss, does not feel it is  Alzheimer's, but feels that he is stable from that standpoint.  Pending  all of these clearances, risks, benefits, and possible complications of  surgery were discussed with the patient and the family, they understand  this and are without question.   PROCEDURES IN-HOUSE:  On Dec 31, 2008, the patient underwent a left  shoulder arthroscopy with a rotator cuff repair and subacromial  decompression by Dr. Thurston Huffman.   He tolerated all of the procedures well, was  admitted to telemetry for  observation overnight.  He had some confusion, but is what appears to be  back to baseline this morning.  Surgical wounds were well approximated.  His scalene block has done well.  He is just starting to feel some pain  in his shoulder now.  Pendulum exercises were demonstrated for the  patient as well as the proper way to bathe and dress.  His dressing was  changed today, and OT was consulted for ADLs for today as well as  pendulum exercises and passive range of motion.  He is being discharged  to home in stable condition, weightbearing as tolerated with his left  arm in a sling.  There is no active  range of motion of his left shoulder for the next 6 weeks.  He can do  active range of motion of his elbow, wrist, and hand.  Passive range of  motion only of his left shoulder.  He will start physical therapy as an  outpatient on Jan 02, 2009.  He will follow up with Dr.  Wainer next week  for suture removal.  He is being discharged to home.      Kirstin Shepperson, P.A.      Robert A. Thurston Huffman, M.D.  Electronically Signed    KS/MEDQ  D:  01/01/2009  T:  01/01/2009  Job:  [id number]

## 2010-12-22 NOTE — Op Note (Signed)
NAMEAZARYAH, OLEKSY                 ACCOUNT NO.:  [id number]   MEDICAL RECORD NO.:  [id number]          PATIENT TYPE:  INP   LOCATION:  2027                         FACILITY:  [facility]   PHYSICIAN:  Elana Alm. Thurston Hole, M.D. DATE OF BIRTH:  1930-05-04   DATE OF PROCEDURE:  12/31/2008  DATE OF DISCHARGE:                               OPERATIVE REPORT   PREOPERATIVE DIAGNOSES:  1. Left shoulder rotator cuff tear.  2. Left shoulder biceps tendon rupture.  3. Left shoulder partial labrum tear.  4. Left shoulder impingement.   POSTOPERATIVE DIAGNOSES:  1. Left shoulder rotator cuff tear.  2. Left shoulder biceps tendon rupture.  3. Left shoulder partial labrum tear.  4. Left shoulder impingement.   PROCEDURES:  1. Left shoulder examination under anesthesia followed by      arthroscopically-assisted rotator cuff repair using Arthrex suture      anchor x1 plus PushLock anchor x1.  2. Left shoulder debridement biceps tendon tear and partial labrum      tear.  3. Left shoulder subacromial decompression.   SURGEON:  Elana Alm. Thurston Hole, MD   ASSISTANT:  Julien Girt, PA   ANESTHESIA:  General.   OPERATIVE TIME:  1 hour.   COMPLICATIONS:  None.   INDICATIONS FOR PROCEDURE:  Mr. Hymon is a 77 year old gentleman who  injured his left shoulder in a fall 2 weeks ago with a complete rotator  cuff tear with significant weakness and pain.  This is documented on  exam and MRI and is now to undergo arthroscopy with repair.   DESCRIPTION:  Mr. Thrun was brought to the operating room on Dec 31, 2008, after an interscalene block was placed in the holding area by  Anesthesia.  He was placed on the operating table in supine position.  He received Ancef 1 g IV preoperatively for prophylaxis.  After being  placed under general anesthesia, his left shoulder was examined.  He had  full range of motion of the shoulder with stable ligamentous exam.  He  was then placed in a beach-chair position.   His shoulder and arm was  prepped using sterile DuraPrep and draped using sterile technique.  Originally, through a posterior arthroscopic portal, the arthroscope  with a pump attached was placed and through an anterior portal, an  arthroscopic probe was placed.  On initial inspection, the articular  cartilage in the glenohumeral joint was intact.  The anterior labrum,  posterior labrum partial tearing 25%, which was debrided.  The biceps  tendon was completely torn with the stump in the superior labrum and  this was debrided.  The rest of the superior labrum was intact.  The  anteroinferior labrum and anteroinferior glenohumeral ligament complex  was intact.  Rotator cuff showed a complete tear of the supraspinatus  and the anterior portion of the infraspinatus.  This was partially  debrided arthroscopically.  The rest of the rotator cuff was intact.  The inferior capsular recess was free of pathology.  Subacromial space  was entered, and a lateral arthroscopic portal was made.  Moderately  thickened bursitis was resected.  Impingement was noted, and a  subacromial decompression was carried out removing 6-8 mm of the  undersurface of the anterior, anterolateral, and anteromedial acromion,  and CA ligament release carried out as well.  The [facility] joint was not  impinging on motion and not pathologic and this was not resected.  At  this time, through an accessory lateral portal, one Arthrex 5.5-mm  suture anchor was placed in the greater tuberosity and each of the  sutures in this anchor were passed through the rotator cuff tear in a  mattress suture technique and then tied down arthroscopically with a  firm and tight repair.  The repair was further reinforced with 1  PushLock anchor in the greater tuberosity as well using all 4 of the  suture limbs.  After this was done, it was found to be a firm and tight  repair.  The shoulder could be brought through a full range of motion  with no  impingement on the repair.  At this point, it was felt that all  pathology had been satisfactorily addressed.  The instruments were  removed.  The portals were closed with 3-0 nylon sutures.  Sterile  dressings and abduction sling applied, and then the patient awakened and  taken to the recovery room in stable condition.  Needle and sponge  counts were correct x2 at the end of the case.   FOLLOWUP CARE:  Mr. Foutz will be followed overnight for observation in  telemetry.  He will be discharged tomorrow if stable.  He will be seen  back in the office in a week for sutures out and followup.      Robert A. Thurston Hole, M.D.  Electronically Signed     RAW/MEDQ  D:  12/31/2008  T:  01/01/2009  Job:  [id number]

## 2010-12-23 ENCOUNTER — Ambulatory Visit: Payer: Self-pay | Admitting: Internal Medicine

## 2010-12-25 NOTE — Discharge Summary (Signed)
[place]  Patient:    Trevor Huffman, Trevor Huffman                          MRN: [id number] Adm. Date:  [id number] Attending:  Cleatrice Burke Dictator:   Sherrie George, P.A. CC:         Alleen Borne, M.D.             Cecil Cranker, M.D. [facility]             Michael E. Norins, M.D. [facility]                           Discharge Summary  DATE OF BIRTH:  [birth year]  ADMISSION DIAGNOSES: 1. Recurrent exertional chest pain, status post percutaneous transluminal    coronary angioplasty of the right coronary artery 10-years ago; last    catheterization in May, 1999. 2. Status post abdominal aortic aneurysm repair with stent graft in    December, 2000. 3. Gastroesophageal reflux disease. 4. Hyperlipidemia.  DISCHARGE DIAGNOSES: 1. Severe three-vessel coronary artery disease, 90% left anterior descending    and diagonal, 95% right coronary artery, 40% circumflex with an ejection    fraction of 55%, and recurrent exertional angina/unstable angina. 2. Status post stent graft repair of abdominal aortic aneurysm. 3. Gastroesophageal reflux disease. 4. Hyperlipidemia. 5. Mildly elevated glucoses in the perioperative.  PROCEDURES: 1. Cardiac catheterization on January 12, 2000 by Dr. Juanda Chance. 2. Coronary artery bypass grafting x 5 with left internal mammary artery to    the LAD, saphenous vein graft to the posterior descending and posterior    lateral sequentially, saphenous vein graft to the first diagonal, and    saphenous vein graft to the second diagonal on January 13, 2000 by Dr. Laneta Simmers.  BRIEF HISTORY:  The patient is a 77 year old, white, male, medical patient of Dr. Cecil Cranker and followed by Dr. Rosalyn Gess. Norins.  He is status post PTCA of the right coronary artery approximately 10-years ago.  Since then, he has had a repeat catheterization in May, 1999, which showed a 30% stenosis of his previous PTCA site in the right coronary artery, the  left coronary system had a 40% stenosis after the first diagonal, and an 80% stenosis just prior to the second diagonal, as well as an 80% stenosis prior to the third diagonal.  He had a 50% lesion in his left circumflex system, and the ejection fraction was 55% at that time.  A Cardiolite scan was nonischemic with an ejection fraction of 62%.  He now returns with increasing exertional discomfort, and he was admitted for a cardiac catheterization and further treatment as indicated.  PAST MEDICAL HISTORY: 1. Status post PTCA and repeat cardiac catheterization for coronary artery    disease. 2. Stent repair of his abdominal aortic aneurysm six months ago. 3. Gastroesophageal reflux disease. 4. Hyperlipidemia.  MEDICATIONS ON ADMISSION: 1. Toprol XL 12.5 mg q.d. 2. Altace 5 mg p.o. q.d. 3. Zocor 20 mg q.d. 4. Baby aspirin 81 mg q.d. 5. Zoloft 50 mg p.o. q.a.m. 6. Prilosec 20 mg q.d.  NOTE:  For further history and physical, please see the dictated note.  HOSPITAL COURSE:  The patient was admitted and underwent cardiac catheterization by Dr. Everardo Beals. [place].  This showed his left anterior descending artery had a 70% proximal stenosis after the first diagonal and then an 80-90% before  the second diagonal.  The second diagonal branch itself was about 80% stenosed.  There was also a mid 70% stenosis at the takeoff of a small, third diagonal.  The left circumflex had a 40% stenosis after the first marginal vessel, and the right coronary artery was heavily calcified with a 95% stenosis.  The main body of the vessels were diffusely irregular with obvious plaques in the wall.  There was some narrowing at the takeoff at the posterolateral branches.  After reviewing these studies, Dr. Alleen Borne was consulted, and after reviewing the studies, he agreed that coronary artery bypass grafting was the best method of treatment for this patient.  It was discussed with the patient at [facility].  The risks and benefits were discussed in detail, and it was ultimately decided to proceed with surgery. On January 13, 2000, he was taken to the operating room and underwent coronary artery bypass grafting as described above.  He tolerated the procedure well, came off bypass, and was transferred to the intensive care unit in satisfactory condition.  The diagonals and the RCA were diffusely diseased. The patient remained hemodynamically stable overnight.  The first postoperative morning, the lungs were clear.  He had mild peripheral edema. He had a relatively stable night.  He was somewhat vasodilated with blood pressures about 90 with a high cardiac output.  The chest tubes were removed. The patients Lopressor was held.  The patient did well throughout the day. He was then transferred to the step-down unit.  The patient was started on cardiac rehabilitation on the floor.  He walked 330 feet on January 15, 2000 with O2 saturations at 95%, and telemetry showing a sinus rhythm of 79 and up to 98 with activity.  On the third postoperative day, the patient again was neurologically intact.  The wounds looked good.  Everything was healing nicely, and by January 17, 2000, the lungs were clear, the heart was in sinus rhythm, and electrolytes were normal.  Weight was down slightly from 178.5. His preoperative was 176.  Overall, he was doing well.  He had some atelectasis.  His incentive spirometry was continued, and plans were made to discharge the patient home the following a.m., on January 18, 2000.  DISCHARGE MEDICATIONS: 1. Tylox 1-2 p.o. q.4h. p.r.n. 2. Multivitamin with iron 1 q.d. 3. Toprol XL 25 mg a day, broken in half. 4. Coated aspirin 325 mg q.d. 5. Zoloft 50 mg q.d. 6. Prilosec 20 mg q.d.  FOLLOWUP:  The patient was scheduled to return to Dr. Cecil Cranker in two weeks, and he will return to see Dr. Laneta Simmers in three weeks.  DISCHARGE ACTIVITIES:  Light to moderate.  No lifting  over 10 pounds.  No driving.  No strenuous activity.   DISCHARGE LABORATORY DATA:  Electrolytes normal.  BUN was 12.  Creatinine was 0.8.  His last white count was 10.1, hemoglobin was 9.7, hematocrit was 28, and platelets were 112,000.  CONDITION ON DISCHARGE:  Improving. DD:  01/17/00 TD:  01/17/00 Job: [id number]

## 2010-12-25 NOTE — Assessment & Plan Note (Signed)
[facility]                              CARDIOLOGY OFFICE NOTE   EMIR, NACK                          MRN:          [id number]  DATE:03/02/2006                            DOB:          1930/01/24   The patient is a 77 year old white, married male with history of coronary  artery disease, status post percutaneous intervention of RCA in 1991, status  post CABG in 2001.  He had stent graft of the abdominal aneurysm in 2000 by  Dr. Felipa Huffman.  CABG was done by Dr. Laneta Huffman.  At that time LIMA was  grafted to the LAD, SVG to posterior ascending and posterolateral, SVG to  first diagonal, and SVG to the second diagonal.  He has good left  ventricular function, aortic sclerosis.  He had a prostatectomy by Dr.  Patsi Huffman in July 2005.  He got along quite well with this.  He had no  cardiac symptoms.   MEDICATIONS:  1.  Toprol-XL 25 mg.  2.  Aspirin 81 mg.  3.  Altace 10 mg.  4.  Nexium 40 mg.  5.  Plavix 75 mg.  I believe this was started by Dr. ___________ in 2004 and      2005.  6.  Vytorin 10/40 mg.   Do not have a recent lipid or BNP.   PHYSICAL EXAMINATION:  VITAL SIGNS:  Blood pressure 149/91, pulse 60, normal  sinus rhythm.  NECK:  JVP is not elevated.  Carotid pulses elevated without bruits.  LUNGS:  Clear.  CARDIAC:  Unremarkable.   [name] is six years post CABG, seven years post stent graft of his  abdominal aneurysm.  His plan to have colonoscopy in September.   I should note his blood pressure 149/91.  I suggest this be repeated  tomorrow.  I think it should be okay for him to discontinue the Plavix and  aspirin for five days prior to the colonoscopy.   His physical exam today is unremarkable except for the hypertension.   I suggested he needs to follow closely in [place] and be seen here once a  year as well.   ADDENDUM:  The EKG is normal                              E. Graceann Congress, MD, [facility]   EJL/MedQ  DD:   03/02/2006  DT:  03/02/2006  Job #:  [id number]

## 2010-12-25 NOTE — Op Note (Signed)
NAMETOLLIE, CANA[place]                             ACCOUNT NO.:  [id number]   MEDICAL RECORD NO.:  [id number]                   PATIENT TYPE:  INP   LOCATION:  0373                                 FACILITY:  [facility]   PHYSICIAN:  Sigmund I. Patsi Sears, M.D.         DATE OF BIRTH:  10/17/1929   DATE OF PROCEDURE:  02/24/2004  DATE OF DISCHARGE:                                 OPERATIVE REPORT   PREOPERATIVE DIAGNOSIS:  Benign prostatic hypertrophy with chronic urinary  retention.   POSTOPERATIVE DIAGNOSIS:  Benign prostatic hypertrophy with chronic urinary  retention.   OPERATION PERFORMED:  Open retropubic prostatectomy.   SURGEON:  Sigmund I. Patsi Sears, M.D.   ASSISTANT:  1. Lindaann Slough, M.D.  2. Thyra Breed, MD   ESTIMATED BLOOD LOSS:  500 mg.   PREPARATION:  After appropriate pre-anesthesia, the patient was brought to  the operating room and placed on the operating table in the dorsal supine  position where general endotracheal anesthesia was introduced.  He was then  replaced in a dorsal lithotomy position.  He remained in this position where  the pubis was prepped with Betadine solution and draped in the usual  fashion.   ANESTHESIA:  General endotracheal.   DESCRIPTION OF PROCEDURE:  A horizontal Pfannenstiel incision was made and  subcutaneous tissue was dissected with the electrosurgical unit.  The fascia  was then entered horizontally with the electrosurgical unit and dissected  with blunt and sharp dissection.  The midline of the rectus muscle was  split, and the retropubic area was dissected with blunt dissection.  A huge  prostate was identified, with multiple large veins on the surface.  Multiple  0 Vicryl sutures were then placed as suture ligatures and an incision was  made measuring 5 cm across the width of the prostatic capsule in between the  suture ligatures.  The capsule was then dissected, and a very large 225 mL  prostate was dissected from the bladder  neck, the urethra.  It was removed  in one complete specimen.  Minimal bleeding was noted.  Indigo carmine was  given, and blue dye was seen emanating from the ureteral orifices which were  protected within the bladder.  The bladder neck was then reapproximated at  the level of the urethra and the prostatic capsule oversewn with #1 Vicryl  suture.  Following this, the bladder was irrigated via the #20 Silastic  Foley catheter, and there was no leakage with a watertight closure.   The wound was closed with a #1 PDS running and interrupted sutures.  Following this, it was noted that rectus muscle was reapproximated with 0  Vicryl suture.  Following this, the skin was closed with 3-0 Vicryl suture,  after a Marcaine pump inserted.  It was noted that 0.5  plain Marcaine was  injected into the wound edges 30 minutes prior to closure.  The patient was  given IV Toradol, awakened  and taken to recovery room in good condition.                                               Sigmund I. Patsi Sears, M.D.    SIT/MEDQ  D:  02/24/2004  T:  02/24/2004  Job:  [id number]

## 2010-12-25 NOTE — Cardiovascular Report (Signed)
[place]  Patient:    Trevor Huffman, Trevor Huffman                          MRN: [id number] Proc. Date: 01/12/00 Adm. Date:  [id number] Attending:  Cleatrice Burke CC:         Rosalyn Gess. Norins, M.D. [facility]             E. Graceann Congress, M.D. [facility]             Bruce R. Juanda Chance, M.D. [facility]             Cardiopulmonary Laboratory                        Cardiac Catheterization  CLINICAL HISTORY:  Trevor Huffman is 77 years old and has had a previous PTCA of the right coronary artery in 1991 and has had known LAD disease and had a stent graft placement of abdominal aortic aneurysm December 2000 in [place].  He had the recent onset of exertional chest pain and was seen in the office and scheduled for evaluation.  DESCRIPTION OF PROCEDURE:  The procedure was performed via the right femoral artery using an arterial sheath and 6 French preformed coronary catheters.  We were able to cross the stent graft without any difficulty with the wire and with the catheters.  We took multiple views and attempted to best visualize the proximal LAD lesion.  Distal aortogram was performed to evaluate the stent graft.  The patient tolerated the procedure well and left the laboratory in satisfactory condition.  RESULTS:  PRESSURES:  The aortic pressure was 132/65 with a mean of 89.  Left ventricular pressure was 132/10.  The left main coronary artery:  The left main coronary artery was free of significant disease.  Left anterior descending:  The left anterior descending artery gave rise to three diagonal branches and a septal perforator.  There was 70% proximal stenosis after the first diagonal branch.  There was 80% proximal stenosis after the first septal before the second diagonal branch, which was located just before an aneurysmal dilatation and just before a bend.  There was 80% ostial stenosis and a diagonal that arose at the site of the aneurysmal dilatation.  There was 70% distal  stenosis near the third diagonal branch.  Circumflex artery:  The circumflex artery gave rise to a large and small marginal branch and a posterolateral branch and atrial branch.  There was 40% narrowing after the large marginal branch.  The rest of the vessel was free of significant disease.  Right coronary artery:  The right coronary was a moderate sized vessel that gave rise to a right ventricular branch, posterior descending branch and two posterolateral branches.  There was a 95% stenosis in the proximal right coronary artery.  LEFT VENTRICULOGRAPHY:  The left ventriculogram performed in the RAO projection showed good wall motion with no areas of hypokinesis.  The estimated ejection fraction was 55%.  DISTAL AORTOGRAM:  The distal aortogram was performed which showed dual renal arteries on the left, which were patent, and a patent renal artery on the right. The stent graft was visualized.  CONCLUSIONS:  Coronary artery disease with 70% and 80-90% proximal stenosis in the left anterior descending artery, 40% stenosis in the circumflex artery and 95% stenosis in the right coronary artery with normal LV function.  RECOMMENDATIONS:  I reviewed the films with Dr. Corinda Gubler.  The LAD lesion is very unfavorable for percutaneous intervention and does appear to be worse than on the previous studies.  We think bypass surgery is the best option. DD:  01/12/00 TD:  01/15/00 Job: [id number]

## 2010-12-25 NOTE — Assessment & Plan Note (Signed)
[facility]                              CARDIOLOGY OFFICE NOTE   FARHAAN, MABEE                          MRN:          [id number]  DATE:03/02/2006                            DOB:          23-May-1930    ADDENDUM:  The EKG is normal.                              E. Graceann Congress, MD, [facility]    EJL/MedQ  DD:  03/02/2006  DT:  03/02/2006  Job #:  [id number]

## 2010-12-25 NOTE — Consult Note (Signed)
[place]  Patient:    Trevor Huffman, Trevor Huffman                          MRN: [id number] Proc. Date: 01/12/00 Adm. Date:  [id number] Attending:  Lenoria Farrier Dictator:   Alleen Borne, M.D. CC:         Alleen Borne, M.D.             Cecil Cranker, M.D. [facility]                          Consultation Report  REFERRING PHYSICIAN:  Dr. Charlies Constable.  REASON FOR CONSULTATION:  Severe three vessel coronary disease with unstable angina.  HISTORY OF PRESENT ILLNESS:  This patient is a 77 year old gentleman followed by Dr. Glennon Hamilton with a history of known coronary disease, status post angioplasty of the right coronary artery about 10 years ago.  His most recent catheterization was in May 1999, and showed about 30% stenosis at the previous percutaneous transluminal coronary angioplasty and the right coronary artery. His left coronary system had about 40% stenosis just after the first diagonal branch, and an 80% stenosis just prior to the second diagonal branch, as well as an 80% stenosis prior to the third diagonal branch.  He had about 50% lesion in the small left circumflex system.  Ejection fraction was 55% at that time.  He had a non-ischemic Cardiolite scan in September 2000, with an ejection fraction of 62%.  He recently presented reporting exertional substernal chest pain of several weeks duration.  This pain only occurred with exertion.  He has had no pain at rest or at night.  His symptoms began [place].  They were moderately severe.  He had [place] to presentation.  His symptoms are always relieved with rest.  He underwent the repeat cardiac catheterization today which showed severe three vessel disease.  His left anterior descending artery had 70% proximal stenosis after the first diagonal branch, and then an 80 to 90% stenosis before the second diagonal branch.   The second diagonal itself had about 80% stenosis.  There was also 70% mid vessel stenosis at the takeoff of the small third diagonal branch.  The left circumflex had about 40% proximal stenosis after the first marginal vessel. The right coronary artery was heavily calcified proximally with a 95% stenosis.  The main body of the vessel was diffusely irregular with obvious plaque in the wall.  There was also some narrowing at the takeoff of the posterolateral branch.  Left ventricular ejection fraction was 55%.  PAST MEDICAL HISTORY: 1. Coronary disease as mentioned above. 2. History of abdominal aortic aneurysm, and underwent stent graft repair at    the [facility] six months ago. 3. He is status post a right inguinal hernia repair in 1997. 4. Status post left kidney stone removal in 1979. 5. History of hypercholesterolemia. 6. History of gastroesophageal reflux.  MEDICATIONS ON ADMISSION: 1. Prilosec 20 mg q.d. 2. Zocor 20 mg q.h.s. 3. Zoloft 50 mg q.d. 4. Toprol XL 25 mg q.d. 5. Baby aspirin q.d. 6. Vitamin E q.d. 7. Altace 5 mg q.d. 8. Saw palmetto berries.  REVIEW OF SYSTEMS:  CONSTITUTIONAL:  He denies fevers or chills.  He has had no weight loss or gain.  EYES:  He has  had no visual changes.  ENT:  Negative.  CARDIOVASCULAR:  As above.  He denies paroxysmal nocturnal dyspnea or orthopnea.  He has had no peripheral edema.  RESPIRATORY:  He denies cough or sputum production.  GASTROINTESTINAL:  He has had normal bowel movements.  He denies nausea, vomiting, melena, or bright red blood per rectum.  He does have occasional heartburn type symptoms which he attributes to gastroesophageal reflux.  GASTROURINARY:  He denies dysuria or hematuria.  NEUROLOGIC:  He has had no dizziness or syncope.  He denies any focal weakness or numbness.  PSYCHIATRIC:  He has had some situational depression related to the death of his daughter about 12 years  ago.  HEMATOLOGIC:  He denies any history of bleeding disorders or easy bleeding.  SKIN:  Negative.  ENDOCRINE:  Negative.  ALLERGIES:  Negative.  FAMILY HISTORY:  Significant for his father having had lung cancer and heart disease.  His mother died of stroke.  SOCIAL HISTORY:  He is retired.  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  He quit smoking 25 years ago, but was a heavy smoker prior to that.  He is an Herbalist.  PHYSICAL EXAMINATION:  VITAL SIGNS:  Blood pressure is 115/70, pulse 60 and regular, respiratory rate 16 and unlabored.  GENERAL:  He is a well-developed white male in no distress.  HEENT:  Normocephalic, atraumatic.  Pupils are equal, round and reactive to light and accommodation.  Extraocular movements intact.  His throat is clear.  NECK:  Normal carotid pulses bilaterally.  There are no bruits.  There is no adenopathy or thyromegaly.  CARDIAC:  Regular rate and rhythm with normal heart sounds.  There are no murmurs, rubs, or gallops.  LUNG:  Clear.  ABDOMEN:  Active bowel sounds.  His abdomen is soft, flat, and nontender. There are no palpable masses and no hepatosplenomegaly.  EXTREMITIES:  No peripheral edema.  There are well healed scars on both groins from his aortic stent graft placement.  Pedal pulses are palpable bilaterally.  SKIN:  Warm and dry.  NEUROLOGIC:  Alert and oriented x 3.  Motor and sensory exams are normal.  LABORATORY DATA:  Carotid Doppler examination shows no significant carotid stenosis.  There is minimal plaque in his carotid arteries.  His AVIs are greater than 1 bilaterally.  IMPRESSION:  This patient has severe three vessel coronary disease with unstable anginal symptoms and is at high risk for myocardial infarction.  I agree that coronary artery bypass graft surgery is the best treatment followed by maximum cardiac risk factor reduction.  He has evidence of diffuse  vascular  disease, having had an abdominal aortic aneurysm.  I have discussed the operative procedure of coronary bypass surgery with him, including alternatives, benefits, and risks, including bleeding, possible blood transfusion, infection, stroke, myocardial infarction, and death.  He understands and agrees to proceed with surgery.  We will plan to do this tomorrow, 01/13/00.DD:  01/12/00 TD:  01/12/00 Job: [id number]

## 2010-12-25 NOTE — Assessment & Plan Note (Signed)
[facility]                              CARDIOLOGY OFFICE NOTE   Trevor Huffman, Trevor Huffman                          MRN:          [id number]  DATE:                                      DOB:          [birth year]    This is a continuation of dictation.   [name] is six years post CABG, seven post stent __________ abdominal  aneurysm.  His plan to have colonoscopy in September.   I should note his blood pressure 149/91.  I suggest this be repeated  tomorrow.  I think it should be okay for him to discontinue the Plavix and  aspirin for five days prior to the colonoscopy.   His physical exam today is unremarkable except for the hypertension.   I suggested he needs to follow closely in [place] and be seen here once a  year as well.                              Trevor Cranker, MD, [facility]    EJL/MedQ  DD:  03/02/2006  DT:  03/02/2006  Job #:  [id number]

## 2010-12-25 NOTE — Discharge Summary (Signed)
Trevor Huffman, Trevor Huffman                             ACCOUNT NO.:  [id number]   MEDICAL RECORD NO.:  [id number]                   PATIENT TYPE:  INP   LOCATION:  0373                                 FACILITY:  [facility]   PHYSICIAN:  Sigmund I. Patsi Sears, M.D.         DATE OF BIRTH:  27-May-1930   DATE OF ADMISSION:  02/24/2004  DATE OF DISCHARGE:  02/28/2004                                 DISCHARGE SUMMARY   HISTORY OF PRESENT ILLNESS:  This is a 77 year old white male who has had  recent history of urinary retention and has failed multiple voiding trials  with double dose Flomax.  Dr. Patsi Sears discussed surgical intervention  with him due to the large size of his prostate (225 g) and it was decided  that he would have an open prostatectomy.  The patient was taken to [facility] OR where open prostatectomy was performed (see operative note).   PAST MEDICAL HISTORY:  1. History of elevated PSAs, prostate nodule with negative multiple     biopsies.  2. Benign prostatic hypertrophy.  3. History of kidney stones.  4. Coronary artery disease.  5. Triple-A.   HOSPITAL COURSE:  The patient had open prostatectomy with Dr. Patsi Sears  (see operative report).  The patient was then transferred to [facility] [place] for  postop care.  The patient's postop course was relatively uneventful.  Managing the patient's anxiety was very important.  Ditropan XL 10 mg was  prescribed t.i.d. for bladder spasms and the patient was ambulating well by  postop day #1.  He was ready for discharge on postop day #4 and a lot of  time was spent with him and his wife reviewing the postop instructions and  trying to alleviate his anxiety about going home.   LABORATORY DATA:  Hemoglobin, hematocrit, electrolytes were stable on  discharge.  The patient was discharged home with his Foley catheter in  place.   DISCHARGE MEDICATIONS:  1. Percocet for pain.  2. Cipro.  3. Ditropan 10 mg p.o. t.i.d. for bladder spasms.  The patient  is to     discontinue the medication 24 hours prior to having his Foley catheter     removed.  4. Xanax for anxiety.   DISCHARGE DIAGNOSES:  Benign prostatic hypertrophy/bladder outlet  obstruction with urinary retention.   CONDITION ON DISCHARGE:  Stable.   PLAN:  The patient will follow up with Terri Piedra, nurse practitioner, in  the office approximately 10 day after his surgery for staple removal of his  wound.  At 2 weeks status post surgery, the patient will return back to the  office to have Foley catheter removed.     Terri Piedra, N.P.                         Sigmund I. Patsi Sears, M.D.    [place]  D:  03/03/2004  T:  03/03/2004  Job:  [id number]

## 2010-12-25 NOTE — Op Note (Signed)
[place]  Patient:    Trevor Huffman, Trevor Huffman                          MRN: [id number] Proc. Date: 01/13/00 Adm. Date:  [id number] Attending:  Cleatrice Burke CC:         Alleen Borne, M.D.             CVTS office             E. Graceann Congress, M.D. [facility]             Cath lab                           Operative Report  PREOPERATIVE DIAGNOSIS:  Severe three-vessel coronary artery disease with  POSTOPERATIVE DIAGNOSIS:  Severe three-vessel coronary artery disease with  OPERATIVE PROCEDURE:  ____________  SURGEON:  Alleen Borne, M.D.  ASSISTANT:  ANESTHESIA:  General endotracheal.  CLINICAL HISTORY:  The patient is a 77 year old gentleman with a past medical history of coronary disease, status post angioplasty of the right coronary artery about 10 years ago.  He underwent catheterization in May of 1999 which showed moderate coronary disease and was managed medically.  He had a nonischemic Cardiolite scan in September of 2000 with an ejection fraction of 62%.  He recently presented with a several week history of exertional substernal chest pain that was increased in frequency and with less exertion. Repeat cardiac catheterization showed severe three-vessel disease.  His LAD had a 70% proximal stenosis after the first diagonal branch and then an 80% to 90% stenosis before the second diagonal branch.  The second diagonal branch itself had about 80% stenosis.  There was also a 70% midvessel LAD stenosis at the take off of a small third diagonal branch.  The left circumflex had about 40% proximal stenosis after the first marginal vessel and the distal vessel was small.  The right coronary artery was a large dominant vessel that was heavily calcified proximally with a 95% focal stenosis.  The main body of the vessel was diffusely irregular with obvious plaque in the wall.  There was also about 60% narrowing at the take off of the posterolateral branch.   Left ventricular ejection fraction was 55%.  After review of the angiogram and examination of the patient, it was felt that coronary artery bypass graft surgery was the best treatment.  I discussed the operative procedure with the patient including alternatives to surgery, benefits and risks including bleeding, possible blood transfusion, infection, stroke, myocardial infarction and death.  He understood and agreed to proceed.  DESCRIPTION OF PROCEDURE:  The patient was taken to the operating room and placed on the table in supine position.  After induction of general endotracheal anesthesia, a Foley catheter was placed in the bladder using sterile technique.  Then the chest, abdomen and both lower extremities were prepped and draped in the usual sterile manner.  The chest was entered through a median sternotomy incision and the pericardium opened in the midline. Examination of the heart showed good ventricular contractility.  The ascending aorta had no palpable plaques in it.  Then the left internal mammary artery was harvested from the chest wall as a pedicle graft.  This was a large caliber vessel with excellent blood flow through it.  At the same time, a segment of greater saphenous vein was harvested from the right lower  leg and this vein was of medium size and good quality.  Then the patient was heparinized and when an adequate activated clotting time was achieved, the distal ascending aorta was cannulated using a 6.5 mm aortic cannula for arterial inflow.  Venous outflow was achieved using a two-stage venous cannula through the right atrial appendage.  An antegrade cardioplegia and vent cannula was inserted into the aortic root.  The patient was placed on cardiopulmonary bypass and the distal coronary arteries identified.  The LAD was a large graftable vessel that had no distal disease beyond the third diagonal branch.  The first and second diagonal branches were small to  medium-sized vessels that were diffusely diseased but graftable.  The third diagonal branch was a tiny artery that was not graftable.  The first marginal branch of the left circumflex had no significant disease in it.  The right coronary artery was diffusely diseased with plaque extending down to the take off of the posterior descending coronary artery.  There was also plaque palpable along the continuation of the right coronary artery out to the posterolateral branch.  The posterior descending artery itself had some patchy plaque present out to the distal portion of the vessel.  The posterolateral branch had no significant disease in it.  Then the aorta was cross-clamped and 500 cc of cold blood antegrade cardioplegia was administered in the aortic root with quick arrest of the heart.  Systemic hypothermia to 20 degrees centigrade and topical hypothermia with iced saline was used.  A temperature probe was placed in the septum and an insulating pad in the pericardium.  The first distal anastomosis was performed to the posterior descending branch of the right coronary artery.  The internal diameter was about 1.75 mm.  The conduit used was a segment of greater saphenous vein and the anastomosis performed in a sequential side-to-side manner using continuous 7-0 Prolene suture.  The flow was measured through the graft and was excellent.  The second distal anastomosis was performed to the posterolateral branch of the right coronary artery.  The internal diameter of this vessel was 1.75 mm. There was proximal stenosis at the take-off of the posterolateral branch.  A 1.5 mm probe passed through this area with difficult7.  The conduit used was the same segment of greater saphenous vein.  The anastomosis was performed in a sequential end-to-side manner using continuous 7-0 Prolene suture.  The flow was measured through the graft and was excellent.  Then a dose of cardioplegia was given down the  vein graft and in the aortic root.   The third distal anastomosis was performed to the first diagonal branch.  The internal diameter was 1.5 to 1.6 mm.  The conduit used was a second segment of greater saphenous vein.   The anastomosis was performed in a end-to-side manner using continuous 7-0 Prolene suture.  The flow was measured through the graft and was excellent.  The fourth distal anastomosis was performed to the second diagonal branch. The internal diameter was 1.5 to 1.6 mm.  The conduit used was the third segment of greater saphenous vein.  The anastomosis was performed in a end-to-side manner using continuous 7-0 Prolene suture.  The flow was again measured through the graft and was excellent.  Then another dose of cardioplegia was given down the vein grafts and into the aortic root.  The fifth distal anastomosis was performed to the distal portion of the left anterior descending coronary artery.  The internal diameter was 2.5 mm.  The conduit used was the left internal mammary artery and this was brought through an opening in the left pericardium anterior to the phrenic nerve.  It was anastomosed to the LAD in an end-to-side manner using continuous 8-0 Prolene suture.  The pedicle was tacked to the epicardium with 6-0 Prolene sutures. The patient was rewarmed to 37 degrees centigrade and the clamp removed from the mammary pedicle.  There was rapid warming of the ventricular septum and return of spontaneous fibrillation.  The crossclamp removed with a time of minutes and the patient defibrillated into sinus rhythm.  A partial occlusion clamp was placed on the aortic root and the three proximal vein graft anastomoses were performed in end-to-side  manner using continuous 6-0 Prolene suture.  The clamps were removed, the vein grafts deaired and the clamps removed from them.  The proximal and distal anastomoses appeared hemostatic and the line of the grafts satisfactory.  Graft  markers were placed around the proximal anastomoses.  Two temporary right ventricular and right atrial pacing wires were placed and brought out through the skin.  When the patient had rewarmed to 37 degrees centigrade, he was weaned from cardiopulmonary bypass on low-dose dopamine.  Total bypass time was 116 minutes.  Cardiac function appeared excellent with a cardiac output of 4 to 5L per minute.  Protamine was given and the venous and aortic cannulas were removed without difficulty.  Hemostasis was achieved.  Three chest tubes were placed with a tube in the posterior pericardium and one in the left pleural space and one in the anterior mediastinum.  The pericardium was reapproximated over the heart.  The sternum was closed with #6 stainless steel wires.  The fascia was closed with continuous #1 Vicryl suture.  Subcutaneous tissue was closed using continuous 2-0 Vicryl and the skin with 3-0 Vicryl subcuticular closure.  The lower extremity vein harvest site was closed in layers in a similar manner.  The sponge, needle and instrument counts were correct according to the scrub nurse.  Dry sterile dressings were applied over the incisions and around the chest tubes which were hooked to Pleur-Evac suction. The patient remained hemodynamically stable and was transported to the SICU in guarded but stable condition. DD:  01/13/00 TD:  01/17/00 Job: [id number]

## 2010-12-25 NOTE — H&P (Signed)
NAME:  Trevor Huffman, Trevor Huffman                             ACCOUNT NO.:  [id number]   MEDICAL RECORD NO.:  [id number]                   PATIENT TYPE:  AMB   LOCATION:  [facility]                                 FACILITY:  [facility]   PHYSICIAN:  Sigmund I. Patsi Sears, M.D.         DATE OF BIRTH:  [birth year]   DATE OF ADMISSION:  DATE OF DISCHARGE:                                HISTORY & PHYSICAL   HISTORY OF PRESENT ILLNESS:  This is a 77 year old white male who has a  recent history of urinary retention.  He was in [place] for a business  meeting and had a catheter placed.  He followed up with Dr. Patsi Sears in  our office, where cystoscopy was performed and the patient was noted to have  an overgrowth of prostate tissue.  No bladder tumors, masses or lesions were  noted on cystoscopy.  He failed two voiding trials with double-dose Flomax  therapy and Dr. Patsi Sears discussed an open prostatectomy with the patient  due to his large prostate volume (225 grams).   PAST MEDICAL HISTORY:  1. History of elevated PSAs, prostate nodule with negative multiple     biopsies.  2. BPH, BOO.  3. History of kidney stones.  4. Coronary artery disease.  5. AAA.   PAST SURGICAL HISTORY:  1. CABG in 2000.  2. AAA repair in 2001.  3. Kidney stone retrieval in 1975.   SOCIAL HISTORY:  The patient is married and has three sons.  He is a retired  Psychologist, sport and exercise and lives in [place], [place] and [place] at  different times of the years.   MEDICATIONS:  1. Plavix 75 mg q. day.  2. Toprol XL 25 mg q. day.  3. Aspirin 81 mg q. day.  4. Nexium 40 mg q. day.  5. Zocor 40 mg q. day.  6. Altace 10 mg q. day.   ALLERGIES:  No known drug allergies.   REVIEW OF SYSTEMS:  Significant for urinary retention, BPH, bladder spasms,  anxiety.  Note - All other review of systems were noncontributory.   PHYSICAL EXAMINATION:  GENERAL:  This is a 77 year old, well-developed, well-  nourished, white male in no  acute distress.  HEENT:  Normocephalic, PERRLA.  Throat clear.  NECK:  Supple.  CARDIAC:  Regular rate and rhythm.  RESPIRATORY:  Clear to auscultation bilaterally.  GI:  Soft, nontender, positive bowel sounds.  GU:  Foley catheter intact, 4+ lobular prostate.  NEUROLOGIC:  Alert and oriented times three.  EXTREMITIES:  No edema noted bilaterally.   IMAGING:  Prostate ultrasound shows prostate volume of 225 grams.   ASSESSMENT:  Bladder outlet obstruction/benign prostatic hypertrophy with  urinary retention.   PLAN:  Dr. Patsi Sears has talked with Trevor Huffman and they have decided to  have an open prostatectomy at [facility] OR.  Risks, benefits and  complications of the procedure have been reviewed with the patient.  He is a  very anxious individual.  We have discussed the procedure and postoperative  care at length.     Terri Piedra, N.P.                         Sigmund I. Patsi Sears, M.D.    HB/MEDQ  D:  03/02/2004  T:  03/02/2004  Job:  [id number]

## 2010-12-28 ENCOUNTER — Ambulatory Visit (INDEPENDENT_AMBULATORY_CARE_PROVIDER_SITE_OTHER): Payer: Medicare Other | Admitting: Internal Medicine

## 2010-12-28 ENCOUNTER — Encounter: Payer: Self-pay | Admitting: Internal Medicine

## 2010-12-28 VITALS — BP 128/80 | HR 57 | Resp 18 | Ht 70.0 in | Wt 177.0 lb

## 2010-12-28 DIAGNOSIS — I2510 Atherosclerotic heart disease of native coronary artery without angina pectoris: Secondary | ICD-10-CM

## 2010-12-28 DIAGNOSIS — E785 Hyperlipidemia, unspecified: Secondary | ICD-10-CM

## 2010-12-28 NOTE — Progress Notes (Signed)
HPI:  [name] is 77 years old and return for management of CAD. He had bypass surgery in 2000 after multiple PCI procedures. In April 2009 he had a bare-metal stent to the saphenous vein graft to the diagonal branch of LAD in [place]. He has done very well since that time has had no recent chest pain shortness of breath or palpitations. His other problems include hyperlipidemia and he had a good lipid profile in May of this year. He also has a AAA and had an aortic stent graft. He has had some problems recently with his memory.  Doing well. Plays golf regularly.  Walks for exercise. No CP or dyspnea. BP well controlled. Tolerating medicines well. Has tingling in feet. No ulceration.     ROS: All systems negative except as listed in HPI, PMH and Problem List.  Past Medical History  Diagnosis Date  . CAD (coronary artery disease)   . HTN (hypertension)   . Dementia   . Sprain and strain of other specified sites of knee and leg   . Lipoma   . Trigger finger   . DDD (degenerative disc disease), lumbosacral   . HLD (hyperlipidemia)   . Cluster headache   . Nephrolithiasis   . Hypertrophy of prostate without urinary obstruction and other lower urinary tract symptoms (LUTS)   . GERD (gastroesophageal reflux disease)   . AAA (abdominal aortic aneurysm)     Current Outpatient Prescriptions  Medication Sig Dispense Refill  . alum & mag hydroxide-simeth (MYLANTA) 200-200-20 MG/5ML suspension Take by mouth as needed.        Marland Kitchen aspirin 81 MG tablet Take 81 mg by mouth daily.        Marland Kitchen donepezil (ARICEPT) 5 MG tablet Take 5 mg by mouth at bedtime as needed.        . ezetimibe-simvastatin (VYTORIN) 10-40 MG per tablet Take 1 tablet by mouth at bedtime.        . famotidine (PEPCID) 20 MG tablet Take 20 mg by mouth 2 (two) times daily.        . metoprolol (TOPROL-XL) 50 MG 24 hr tablet Take 50 mg by mouth daily.        . pantoprazole (PROTONIX) 40 MG tablet Take 40 mg by mouth daily.        .  ramipril (ALTACE) 5 MG capsule Take 5 mg by mouth daily.        Marland Kitchen DISCONTD: glucosamine-chondroitin 500-400 MG tablet Take 1 tablet by mouth 3 (three) times daily.           PHYSICAL EXAM: Filed Vitals:   12/28/10 1152  BP: 128/80  Pulse: 57  Resp: 18   General:  Well appearing. No resp difficulty HEENT: normal Neck: supple. JVP flat. Carotids 2+ bilaterally; no bruits. No lymphadenopathy or thryomegaly appreciated. Cor: PMI normal. Regular rate & rhythm. No rubs, gallops or murmurs. Lungs: clear Abdomen: soft, nontender, nondistended. No hepatosplenomegaly. No bruits or masses. Good bowel sounds. Extremities: no cyanosis, clubbing, rash, edema Neuro: alert & orientedx3, cranial nerves grossly intact. Moves all 4 extremities w/o difficulty. Affect pleasant.    ECG: Sinus brady 57. No ST-T wave abnormalities.     ASSESSMENT & PLAN:

## 2010-12-28 NOTE — Assessment & Plan Note (Signed)
No evidence of ischemia. Continue current regimen.   

## 2010-12-28 NOTE — Assessment & Plan Note (Signed)
Due for repeat lipids and liver panel.

## 2010-12-28 NOTE — Patient Instructions (Signed)
Your physician recommends that you return for a FASTING lipid profile:  Your physician wants you to follow-up in: 6 months.  You will receive a reminder letter in the mail two months in advance. If you don't receive a letter, please call our office to schedule the follow-up appointment.

## 2010-12-30 ENCOUNTER — Other Ambulatory Visit: Payer: BLUE CROSS/BLUE SHIELD

## 2011-01-14 ENCOUNTER — Encounter: Payer: Self-pay | Admitting: Internal Medicine

## 2011-01-15 ENCOUNTER — Other Ambulatory Visit: Payer: Self-pay

## 2011-01-15 ENCOUNTER — Encounter: Payer: Self-pay | Admitting: Internal Medicine

## 2011-01-15 ENCOUNTER — Ambulatory Visit (INDEPENDENT_AMBULATORY_CARE_PROVIDER_SITE_OTHER): Payer: Medicare Other | Admitting: Internal Medicine

## 2011-01-15 DIAGNOSIS — I2510 Atherosclerotic heart disease of native coronary artery without angina pectoris: Secondary | ICD-10-CM

## 2011-01-15 DIAGNOSIS — I10 Essential (primary) hypertension: Secondary | ICD-10-CM

## 2011-01-15 DIAGNOSIS — Z9889 Other specified postprocedural states: Secondary | ICD-10-CM

## 2011-01-15 DIAGNOSIS — F068 Other specified mental disorders due to known physiological condition: Secondary | ICD-10-CM

## 2011-01-15 DIAGNOSIS — G609 Hereditary and idiopathic neuropathy, unspecified: Secondary | ICD-10-CM

## 2011-01-15 MED ORDER — RAMIPRIL 5 MG PO CAPS: 5.0000 mg | ORAL_CAPSULE | Freq: Every day | ORAL | Status: DC

## 2011-01-15 NOTE — Patient Instructions (Signed)
Depression - I am so sorry about the problems you are having with your son. I suggest both counseling and antidepressant medication to help with some of the symtoms of worry and tearfullness.  Tingling feet - this is called a peripheral parathesia: it may be B12 or thyroid related. Please provide me the name and telephone number of your doctor in [place] and I can obtain copies of any recent lab work before ordering any additional lab. This is usually NOT a serious problem.  Your vital signs and exam are really normal.

## 2011-01-15 NOTE — Progress Notes (Signed)
  Subjective:    Patient ID: Joud Ingwersen, male    DOB: 1930-07-22, 77 y.o.   MRN: [id number]  HPI Mr. Reiling has returned from [place] and his concierge physician. His chief complaint is emotional with the travails associated with one of his son's who had an anoxic injury at birth and his had problems. He is now having more trouble with drugs and stability. Mr.  Stohr reports that he is often tearful, broken hearted and continuously upset. He is not interested in counseling or medication.   He does report a problem with peripheral parathesia that is intermittent. He is taking all his medications. This has been previously evaluated by Dr. Sandria Manly, including EMG/NCS. The paresthesia does not limit his activities.  Mr. Lipa does repeat himself on all his information without being aware of it.   He is followed closely by his "retainer" internist in [place].  Past Medical History  Diagnosis Date  . CAD (coronary artery disease)   . HTN (hypertension)   . Dementia   . Sprain and strain of other specified sites of knee and leg   . Lipoma   . Trigger finger   . DDD (degenerative disc disease), lumbosacral   . HLD (hyperlipidemia)   . Cluster headache   . Nephrolithiasis   . Hypertrophy of prostate without urinary obstruction and other lower urinary tract symptoms (LUTS)   . GERD (gastroesophageal reflux disease)   . AAA (abdominal aortic aneurysm)    Past Surgical History  Procedure Date  . Abdominal aortic aneurysm repair   . Nephrostomy / nephrotomy   . Inguinal hernia repair   . Coronary artery bypass graft   . Coronary stent placement   . Prostatectomy   . Ventral hernia repair    Family History  Problem Relation Age of Onset  . Stroke    . Lung cancer    . Cancer Father     lung    History   Social History  . Marital Status: Married    Spouse Name: N/A    Number of Children: 3  . Years of Education: N/A   Occupational History  . retired    Social History Main Topics    . Smoking status: Former Smoker    Types: Cigarettes    Quit date: 08/10/1995  . Smokeless tobacco: Never Used  . Alcohol Use: Yes     once a month  . Drug Use: No  . Sexually Active: Not on file   Other Topics Concern  . Not on file   Social History Narrative   HSG, no college. Married. Had a daughter - killed in an MVA. 3 sons - one a physician. Lives with his wife and is independent in all ADLs. Retired - Multimedia programmer business.Physician Roster:  Charolotte Capuchin: cardiology - Dr. Virgina Evener [address], [place] [address]; IM - Dr. Judithann Sauger, [email or web address], (c) [phone]; Vascular surg - Dr. Felipa Eth, [facility], [place], [place]. [address] [place], [place] [address]; Neurology - Renda Rolls, [address] [place]  [address], [phone]       Review of Systems     Objective:   Physical Exam        Assessment & Plan:

## 2011-01-17 ENCOUNTER — Encounter: Payer: Self-pay | Admitting: Internal Medicine

## 2011-01-17 NOTE — Assessment & Plan Note (Signed)
Continued problem but no further work-up is indicated. He is not limited or disturbed by these symptoms

## 2011-01-17 NOTE — Assessment & Plan Note (Signed)
He is taking aricept. He continues to repeat himself with lack of awareness. He does remain highly functional.

## 2011-01-17 NOTE — Assessment & Plan Note (Signed)
Good control of BP on current medications. Will continue the same

## 2011-01-17 NOTE — Assessment & Plan Note (Signed)
He reports that he is due for follow-up of his endograft. He has had no problems since his stent placement.

## 2011-01-17 NOTE — Assessment & Plan Note (Addendum)
He is stable. He has  seen Dr. Gala Romney May '12 and he is current with his cardiologist in [place].

## 2011-03-26 ENCOUNTER — Encounter: Payer: Self-pay | Admitting: Internal Medicine

## 2011-03-26 ENCOUNTER — Ambulatory Visit (INDEPENDENT_AMBULATORY_CARE_PROVIDER_SITE_OTHER): Payer: Medicare Other | Admitting: Internal Medicine

## 2011-03-26 DIAGNOSIS — K219 Gastro-esophageal reflux disease without esophagitis: Secondary | ICD-10-CM

## 2011-03-26 NOTE — Patient Instructions (Addendum)
Reflux - mostly upper throat and mouth. There are no lesions. Plan - take the Protonix 40 mg every morning and take the pepcid every night.For the possibility of an oral issue such as thrush will have you use magic mouthwash swish and spit 4 times a day.  If this doesn't work we will need to get this looked at by GI. A refill authorization on the protonix has been called in and a new Rx for magic mouthwash.  Depression - if you have too many bad days and too much worry there are medications that can help.

## 2011-03-26 NOTE — Progress Notes (Signed)
  Subjective:    Patient ID: Trevor Huffman, male    DOB: 02/23/1930, 77 y.o.   MRN: [id number]  HPI Mr. Sobek presents for "reflux" so that even his tongue burns. He has protonix on his med list as well as famotidine but may not take either one. He has not taken any liquid antacids or other treatment.    Review of Systems System review is negative for any constitutional, cardiac, pulmonary, GI or neuro symptoms or complaints     Objective:   Physical Exam Vitals - stable HEENT - candy residue in his mouth. NO other oral lesions Abdomen - BS +, no tenderness to deep palpation        Assessment & Plan:  Sore mouth - may have thrush.  Plan - magic mouthwash with xylocaine swish and spit 4 times a day as needed.

## 2011-03-29 NOTE — Assessment & Plan Note (Signed)
Patient's sore mouth may be GERD related and he may have thrush.  Plan - he is to take PPI in the AM and H2 blocker in PM as needed.

## 2011-05-12 LAB — BASIC METABOLIC PANEL
BUN: 13
Calcium: 9.8
Chloride: 108
Creatinine, Ser: 0.88
GFR calc non Af Amer: >60

## 2011-05-12 LAB — POCT HEMOGLOBIN-HEMACUE: Hemoglobin: 15.3

## 2011-05-21 ENCOUNTER — Telehealth: Payer: Self-pay | Admitting: Internal Medicine

## 2011-05-21 ENCOUNTER — Ambulatory Visit: Payer: Medicare Other | Admitting: Internal Medicine

## 2011-05-21 NOTE — Telephone Encounter (Signed)
[facility] faxed to [phone]  05/21/11/km

## 2011-11-14 ENCOUNTER — Other Ambulatory Visit: Payer: Self-pay | Admitting: Internal Medicine

## 2011-12-26 ENCOUNTER — Other Ambulatory Visit: Payer: Self-pay | Admitting: Internal Medicine

## 2011-12-27 ENCOUNTER — Other Ambulatory Visit: Payer: Self-pay | Admitting: Internal Medicine

## 2012-01-17 ENCOUNTER — Encounter (INDEPENDENT_AMBULATORY_CARE_PROVIDER_SITE_OTHER): Payer: Medicare Other | Admitting: Cardiology

## 2012-01-17 ENCOUNTER — Encounter: Payer: Self-pay | Admitting: Cardiology

## 2012-01-17 VITALS — BP 150/86 | HR 66 | Ht 70.0 in | Wt 169.0 lb

## 2012-01-17 DIAGNOSIS — I2510 Atherosclerotic heart disease of native coronary artery without angina pectoris: Secondary | ICD-10-CM

## 2012-01-19 ENCOUNTER — Ambulatory Visit (INDEPENDENT_AMBULATORY_CARE_PROVIDER_SITE_OTHER): Payer: Medicare Other | Admitting: Internal Medicine

## 2012-01-19 DIAGNOSIS — I2510 Atherosclerotic heart disease of native coronary artery without angina pectoris: Secondary | ICD-10-CM

## 2012-01-19 DIAGNOSIS — I10 Essential (primary) hypertension: Secondary | ICD-10-CM

## 2012-01-19 DIAGNOSIS — F068 Other specified mental disorders due to known physiological condition: Secondary | ICD-10-CM

## 2012-01-19 DIAGNOSIS — Z9889 Other specified postprocedural states: Secondary | ICD-10-CM

## 2012-01-23 NOTE — Assessment & Plan Note (Signed)
No complaints of chest pain, no limitations in physical activity.

## 2012-01-23 NOTE — Progress Notes (Signed)
Subjective:    Patient ID: Trevor Huffman, male    DOB: 1929-11-29, 78 y.o.   MRN: [id number]  HPI Mr. Dolley has returned to [facility] for the summer. He had an eventful winter in [facility] and was followed by his concierge physician - Dr. Donnie Coffin. Mr Vos does not recall if he had any routine labs while in [place]. He has not medical complaints today and reports that he has been doing well, playing golf and without any major medical problems. During his interview he does repeat himself incessantly but his speech is clear, cognitive skills seem fluent. Last neurology note from Dr. Sandria Manly reviewed - carries diagnosis of Alzheimer's dementia.  Past Medical History  Diagnosis Date  . CAD (coronary artery disease)   . HTN (hypertension)   . Dementia   . Sprain and strain of other specified sites of knee and leg   . Lipoma   . Trigger finger   . DDD (degenerative disc disease), lumbosacral   . HLD (hyperlipidemia)   . Cluster headache   . Nephrolithiasis   . Hypertrophy of prostate without urinary obstruction and other lower urinary tract symptoms (LUTS)   . GERD (gastroesophageal reflux disease)   . AAA (abdominal aortic aneurysm)    Past Surgical History  Procedure Date  . Abdominal aortic aneurysm repair   . Nephrostomy / nephrotomy   . Inguinal hernia repair   . Coronary artery bypass graft   . Coronary stent placement   . Prostatectomy   . Ventral hernia repair    Family History  Problem Relation Age of Onset  . Stroke    . Lung cancer    . Cancer Father     lung    History   Social History  . Marital Status: Married    Spouse Name: N/A    Number of Children: 3  . Years of Education: N/A   Occupational History  . retired    Social History Main Topics  . Smoking status: Former Smoker    Types: Cigarettes    Quit date: 08/10/1995  . Smokeless tobacco: Never Used  . Alcohol Use: Yes     once a month  . Drug Use: No  . Sexually Active: Not on file   Other Topics Concern   . Not on file   Social History Narrative   HSG, no college. Married. Had a daughter - killed in an MVA. 3 sons - one a physician. Lives with his wife and is independent in all ADLs. Retired - Multimedia programmer business.Physician Roster:  Charolotte Capuchin: cardiology - Dr. Virgina Evener [address], [place] [address]; IM - Dr. Judithann Sauger, [email or web address], (c) [phone]; Vascular surg - Dr. Felipa Eth, [facility], [place], [place]. [address], [place], [place] [address]; Neurology - Renda Rolls, [address], [place]  [address], [phone]       Review of Systems System review is negative for any constitutional, cardiac, pulmonary, GI or neuro symptoms or complaints other than as described in the HPI.     Objective:   Physical Exam T 97.6  BP 130/80  Wt 169 lbs  Gen'l- WNWD white man who looks good for his age HEENT- [place]/AT, C&S clear, PERRLA Cor- RRR Pulm - normal respirations Neuro - repeats himself, speech is clear, normal gait and balance  Labs - will attempt to obtain labs from [place]        Assessment & Plan:

## 2012-01-23 NOTE — Assessment & Plan Note (Signed)
Very stable.  

## 2012-01-23 NOTE — Assessment & Plan Note (Signed)
BP Readings from Last 3 Encounters:  01/17/12 150/86  03/26/11 138/80  01/15/11 110/68   Generally well controlled but elevated today.  Plan Continue present medications.

## 2012-01-23 NOTE — Assessment & Plan Note (Signed)
Appears to be stable. No formal testing done, i.e. No MMSE or performance scores. He does have a supportive home environment.   Plan  Continue Aricept

## 2012-02-18 ENCOUNTER — Other Ambulatory Visit: Payer: Self-pay | Admitting: Orthopedic Surgery

## 2012-02-18 NOTE — Progress Notes (Signed)
[place] beach-will try to get here mom for bmet-if not will need istat tues am Lives 1/2 yr [place] Has cardiologist there-and sees Harrell here-notes in epic-pcp-dr norins

## 2012-02-20 NOTE — H&P (Addendum)
Geremiah Fussell is an 78 y.o. male.   Chief Complaint: chronic locking of right index finger due to STS at A1 pulley HPI: More than one year history of locking STS of right index finger failing prior injection.  Past Medical History  Diagnosis Date  . CAD (coronary artery disease)   . HTN (hypertension)   . Dementia   . Sprain and strain of other specified sites of knee and leg   . Lipoma   . Trigger finger   . DDD (degenerative disc disease), lumbosacral   . HLD (hyperlipidemia)   . Cluster headache   . Nephrolithiasis   . Hypertrophy of prostate without urinary obstruction and other lower urinary tract symptoms (LUTS)   . GERD (gastroesophageal reflux disease)   . AAA (abdominal aortic aneurysm)     Past Surgical History  Procedure Date  . Abdominal aortic aneurysm repair   . Nephrostomy / nephrotomy   . Inguinal hernia repair   . Coronary artery bypass graft   . Coronary stent placement   . Prostatectomy   . Ventral hernia repair     Family History  Problem Relation Age of Onset  . Stroke    . Lung cancer    . Cancer Father     lung    Social History:  reports that he quit smoking about 16 years ago. His smoking use included Cigarettes. He has never used smokeless tobacco. He reports that he drinks alcohol. He reports that he does not use illicit drugs.  Allergies: No Known Allergies  No prescriptions prior to admission    No results found for this or any previous visit (from the past 48 hour(s)). No results found.  Review of Systems  Constitutional: Negative.   HENT: Negative.   Eyes: Negative.   Respiratory: Negative.   Cardiovascular:       Elevated blood pressure on medication. History of CABG 1994  Gastrointestinal: Negative.   Genitourinary:       History of prostate cancer and prostatectomy 2004  Musculoskeletal:       Locking STS of left index finger  Skin: Negative.   Endo/Heme/Allergies: Negative.   Psychiatric/Behavioral:       History of  early dementia    There were no vitals taken for this visit. Physical Exam  Constitutional: He is oriented to person, place, and time. He appears well-developed and well-nourished.  HENT:  Head: Atraumatic.       Arcus senilis  Neck: Normal range of motion. Neck supple.  Cardiovascular: Normal rate and regular rhythm.   Respiratory: Breath sounds normal.  GI: Soft. Bowel sounds are normal.  Musculoskeletal: Normal range of motion.       Locking right index finger.   Neurological: He is alert and oriented to person, place, and time. He has normal reflexes.  Skin: Skin is warm and dry.  Psychiatric: He has a normal mood and affect.       Repetitive questioning and mild cognitive impairment based on long relationship with Mr Hobby.     Assessment/Plan STS of right index finger.   Plan release of right index A-1 pulley under local anesthesia.  Questions invited and answered in detail.  Viraj Liby JR,Ajai Terhaar V 02/20/2012, 4:34 PM  H&P documentation: 02/22/2012  -History and Physical Reviewed  -Patient has been re-examined  -No change in the plan of care  Wyn Forster, MD

## 2012-02-21 ENCOUNTER — Encounter (HOSPITAL_BASED_OUTPATIENT_CLINIC_OR_DEPARTMENT_OTHER)
Admission: RE | Admit: 2012-02-21 | Discharge: 2012-02-21 | Disposition: A | Discharge status: Home / Self Care | Payer: Medicare Other | Source: Ambulatory Visit | Attending: Orthopedic Surgery | Admitting: Orthopedic Surgery

## 2012-02-21 LAB — BASIC METABOLIC PANEL
BUN: 18 mg/dL (ref 6–23)
Chloride: 104 mEq/L (ref 96–112)
GFR calc Af Amer: >90 mL/min (ref >90)
GFR calc non Af Amer: 79 mL/min — ABNORMAL LOW (ref >90)
Glucose, Bld: 88 mg/dL (ref 70–99)
Potassium: 4.4 mEq/L (ref 3.5–5.1)
Sodium: 143 mEq/L (ref 135–145)

## 2012-02-22 ENCOUNTER — Encounter (HOSPITAL_BASED_OUTPATIENT_CLINIC_OR_DEPARTMENT_OTHER): Payer: Self-pay | Admitting: *Deleted

## 2012-02-22 ENCOUNTER — Encounter (HOSPITAL_BASED_OUTPATIENT_CLINIC_OR_DEPARTMENT_OTHER): Payer: Self-pay | Admitting: Anesthesiology

## 2012-02-22 ENCOUNTER — Encounter (HOSPITAL_BASED_OUTPATIENT_CLINIC_OR_DEPARTMENT_OTHER)
Admission: RE | Disposition: A | Discharge status: Home / Self Care | Payer: Self-pay | Source: Ambulatory Visit | Attending: Orthopedic Surgery

## 2012-02-22 ENCOUNTER — Ambulatory Visit (HOSPITAL_BASED_OUTPATIENT_CLINIC_OR_DEPARTMENT_OTHER): Payer: Medicare Other | Admitting: Anesthesiology

## 2012-02-22 ENCOUNTER — Encounter (HOSPITAL_BASED_OUTPATIENT_CLINIC_OR_DEPARTMENT_OTHER): Payer: Self-pay | Admitting: Orthopedic Surgery

## 2012-02-22 ENCOUNTER — Ambulatory Visit (HOSPITAL_BASED_OUTPATIENT_CLINIC_OR_DEPARTMENT_OTHER)
Admission: RE | Admit: 2012-02-22 | Discharge: 2012-02-22 | Disposition: A | Discharge status: Home / Self Care | Payer: Medicare Other | Source: Ambulatory Visit | Attending: Orthopedic Surgery | Admitting: Orthopedic Surgery

## 2012-02-22 ENCOUNTER — Other Ambulatory Visit: Payer: Self-pay | Admitting: Internal Medicine

## 2012-02-22 DIAGNOSIS — M65839 Other synovitis and tenosynovitis, unspecified forearm: Secondary | ICD-10-CM | POA: Insufficient documentation

## 2012-02-22 DIAGNOSIS — I2510 Atherosclerotic heart disease of native coronary artery without angina pectoris: Secondary | ICD-10-CM | POA: Insufficient documentation

## 2012-02-22 DIAGNOSIS — I10 Essential (primary) hypertension: Secondary | ICD-10-CM | POA: Insufficient documentation

## 2012-02-22 DIAGNOSIS — Z8546 Personal history of malignant neoplasm of prostate: Secondary | ICD-10-CM | POA: Insufficient documentation

## 2012-02-22 DIAGNOSIS — Z951 Presence of aortocoronary bypass graft: Secondary | ICD-10-CM | POA: Insufficient documentation

## 2012-02-22 DIAGNOSIS — F0390 Unspecified dementia without behavioral disturbance: Secondary | ICD-10-CM | POA: Insufficient documentation

## 2012-02-22 DIAGNOSIS — K219 Gastro-esophageal reflux disease without esophagitis: Secondary | ICD-10-CM | POA: Insufficient documentation

## 2012-02-22 HISTORY — PX: TRIGGER FINGER RELEASE: SHX641

## 2012-02-22 SURGERY — RELEASE, A1 PULLEY, FOR TRIGGER FINGER
Anesthesia: Monitor Anesthesia Care | Site: Finger | Laterality: Right | Wound class: Clean

## 2012-02-22 MED ORDER — PROPOFOL 10 MG/ML IV EMUL
INTRAVENOUS | Status: DC | PRN
  Administered 2012-02-22: 25 ug/kg/min via INTRAVENOUS

## 2012-02-22 MED ORDER — LIDOCAINE HCL 2 % IJ SOLN: INTRAMUSCULAR | Status: DC | PRN

## 2012-02-22 MED ORDER — LACTATED RINGERS IV SOLN
INTRAVENOUS | Status: DC
  Administered 2012-02-22: 09:00:00 via INTRAVENOUS

## 2012-02-22 MED ORDER — OXYCODONE HCL 5 MG PO TABS: 5.0000 mg | ORAL_TABLET | Freq: Once | ORAL | Status: DC | PRN

## 2012-02-22 MED ORDER — FENTANYL CITRATE 0.05 MG/ML IJ SOLN: 25.0000 ug | INTRAMUSCULAR | Status: DC | PRN

## 2012-02-22 MED ORDER — LIDOCAINE HCL (CARDIAC) 20 MG/ML IV SOLN
INTRAVENOUS | Status: DC | PRN
  Administered 2012-02-22: 50 mg via INTRAVENOUS

## 2012-02-22 MED ORDER — CHLORHEXIDINE GLUCONATE 4 % EX LIQD: 60.0000 mL | Freq: Once | CUTANEOUS | Status: DC

## 2012-02-22 MED ORDER — METOCLOPRAMIDE HCL 5 MG/ML IJ SOLN: 10.0000 mg | Freq: Once | INTRAMUSCULAR | Status: DC | PRN

## 2012-02-22 MED ORDER — METHYLPREDNISOLONE ACETATE 40 MG/ML IJ SUSP: INTRAMUSCULAR | Status: DC | PRN

## 2012-02-22 MED ORDER — FENTANYL CITRATE 0.05 MG/ML IJ SOLN
INTRAMUSCULAR | Status: DC | PRN
  Administered 2012-02-22: 25 ug via INTRAVENOUS

## 2012-02-22 MED ORDER — ALPRAZOLAM 0.5 MG PO TABS: 0.5000 mg | ORAL_TABLET | ORAL | Status: AC | PRN

## 2012-02-22 MED ORDER — LIDOCAINE HCL 2 % IJ SOLN
INTRAMUSCULAR | Status: DC | PRN
  Administered 2012-02-22: 3 mL

## 2012-02-22 MED ORDER — TRAMADOL HCL 50 MG PO TABS: 50.0000 mg | ORAL_TABLET | Freq: Four times a day (QID) | ORAL | Status: DC | PRN

## 2012-02-22 SURGICAL SUPPLY — 41 items
BANDAGE ADHESIVE 1X3 (GAUZE/BANDAGES/DRESSINGS) ×3 IMPLANT
BLADE SURG 15 STRL LF DISP TIS (BLADE) ×2 IMPLANT
BLADE SURG 15 STRL SS (BLADE) ×1
BNDG ELASTIC 2 VLCR STRL LF (GAUZE/BANDAGES/DRESSINGS) ×3 IMPLANT
BNDG ESMARK 4X9 LF (GAUZE/BANDAGES/DRESSINGS) ×3 IMPLANT
BRUSH SCRUB EZ PLAIN DRY (MISCELLANEOUS) ×3 IMPLANT
CLOTH BEACON ORANGE TIMEOUT ST (SAFETY) ×3 IMPLANT
CORDS BIPOLAR (ELECTRODE) IMPLANT
COVER MAYO STAND STRL (DRAPES) ×3 IMPLANT
COVER TABLE BACK 60X90 (DRAPES) ×3 IMPLANT
CUFF TOURNIQUET SINGLE 18IN (TOURNIQUET CUFF) ×3 IMPLANT
DECANTER SPIKE VIAL GLASS SM (MISCELLANEOUS) IMPLANT
DRAPE EXTREMITY T 121X128X90 (DRAPE) ×3 IMPLANT
DRAPE SURG 17X23 STRL (DRAPES) ×3 IMPLANT
GAUZE SPONGE 4X4 12PLY STRL LF (GAUZE/BANDAGES/DRESSINGS) IMPLANT
GAUZE XEROFORM 1X8 LF (GAUZE/BANDAGES/DRESSINGS) IMPLANT
GLOVE BIO SURGEON STRL SZ 6.5 (GLOVE) ×3 IMPLANT
GLOVE BIOGEL M STRL SZ7.5 (GLOVE) ×3 IMPLANT
GLOVE EXAM NITRILE PF MED BLUE (GLOVE) ×3 IMPLANT
GLOVE ORTHO TXT STRL SZ7.5 (GLOVE) ×3 IMPLANT
GOWN PREVENTION PLUS XLARGE (GOWN DISPOSABLE) ×3 IMPLANT
GOWN STRL REIN XL XLG (GOWN DISPOSABLE) ×3 IMPLANT
NDL SAFETY ECLIPSE 18X1.5 (NEEDLE) ×4 IMPLANT
NEEDLE 27GAX1X1/2 (NEEDLE) ×3 IMPLANT
NEEDLE HYPO 18GX1.5 SHARP (NEEDLE) ×2
NS IRRIG 1000ML POUR BTL (IV SOLUTION) ×3 IMPLANT
PACK BASIN DAY SURGERY FS (CUSTOM PROCEDURE TRAY) ×3 IMPLANT
PAD ALCOHOL SWAB (MISCELLANEOUS) ×3 IMPLANT
PAD CAST 4YDX4 CTTN HI CHSV (CAST SUPPLIES) IMPLANT
PADDING CAST COTTON 4X4 STRL (CAST SUPPLIES)
SPONGE GAUZE 4X4 12PLY (GAUZE/BANDAGES/DRESSINGS) IMPLANT
STOCKINETTE 4X48 STRL (DRAPES) ×3 IMPLANT
STRIP CLOSURE SKIN 1/2X4 (GAUZE/BANDAGES/DRESSINGS) ×3 IMPLANT
SUT PROLENE 4 0 P 3 18 (SUTURE) ×3 IMPLANT
SWABSTICK POVIDONE IODINE SNGL (MISCELLANEOUS) ×3 IMPLANT
SYR 3ML LL SCALE MARK (SYRINGE) ×3 IMPLANT
SYR CONTROL 10ML LL (SYRINGE) ×3 IMPLANT
TOWEL OR 17X24 6PK STRL BLUE (TOWEL DISPOSABLE) ×3 IMPLANT
TRAY DSU PREP LF (CUSTOM PROCEDURE TRAY) ×3 IMPLANT
UNDERPAD 30X30 INCONTINENT (UNDERPADS AND DIAPERS) ×3 IMPLANT
WATER STERILE IRR 1000ML POUR (IV SOLUTION) IMPLANT

## 2012-02-22 NOTE — Anesthesia Postprocedure Evaluation (Signed)
Anesthesia Post Note  Patient: Trevor Huffman  Procedure(s) Performed: Procedure(s) (LRB): RELEASE TRIGGER FINGER/A-1 PULLEY (Right)  Anesthesia type: MAC  Patient location: PACU  Post pain: Pain level controlled  Post assessment: Patient's Cardiovascular Status Stable  Last Vitals:  Filed Vitals:   02/22/12 1000  BP: 147/64  Pulse: 52  Temp:   Resp: 19    Post vital signs: Reviewed and stable  Level of consciousness: alert  Complications: No apparent anesthesia complications

## 2012-02-22 NOTE — Transfer of Care (Signed)
Immediate Anesthesia Transfer of Care Note  Patient: Trevor Huffman  Procedure(s) Performed: Procedure(s) (LRB): RELEASE TRIGGER FINGER/A-1 PULLEY (Right)  Patient Location: PACU  Anesthesia Type: MAC  Level of Consciousness: awake and alert   Airway & Oxygen Therapy: Patient Spontanous Breathing and Patient connected to face mask oxygen  Post-op Assessment: Report given to PACU RN and Post -op Vital signs reviewed and stable  Post vital signs: Reviewed and stable  Complications: No apparent anesthesia complications

## 2012-02-22 NOTE — Anesthesia Preprocedure Evaluation (Signed)
Anesthesia Evaluation  Patient identified by MRN, date of birth, ID band Patient awake    Reviewed: Allergy & Precautions, H&P , NPO status , Patient's Chart, lab work & pertinent test results, reviewed documented beta blocker date and time   Airway Mallampati: II TM Distance: >3 FB Neck ROM: full    Dental   Pulmonary neg pulmonary ROS,          Cardiovascular hypertension, Pt. on medications and Pt. on home beta blockers + CAD and + Cardiac Stents     Neuro/Psych  Headaches, PSYCHIATRIC DISORDERS  Neuromuscular disease    GI/Hepatic Neg liver ROS, GERD-  Medicated and Controlled,  Endo/Other  negative endocrine ROS  Renal/GU negative Renal ROS  negative genitourinary   Musculoskeletal   Abdominal   Peds  Hematology negative hematology ROS (+)   Anesthesia Other Findings See surgeon's H&P   Reproductive/Obstetrics negative OB ROS                           Anesthesia Physical Anesthesia Plan  ASA: III  Anesthesia Plan: MAC   Post-op Pain Management:    Induction: Intravenous  Airway Management Planned: Simple Face Mask  Additional Equipment:   Intra-op Plan:   Post-operative Plan:   Informed Consent: I have reviewed the patients History and Physical, chart, labs and discussed the procedure including the risks, benefits and alternatives for the proposed anesthesia with the patient or authorized representative who has indicated his/her understanding and acceptance.   Dental Advisory Given  Plan Discussed with: CRNA and Surgeon  Anesthesia Plan Comments:         Anesthesia Quick Evaluation

## 2012-02-22 NOTE — Op Note (Signed)
[id number] 

## 2012-02-22 NOTE — Brief Op Note (Signed)
02/22/2012  9:44 AM  PATIENT:  Jacqulyn Bath  78 y.o. male  PRE-OPERATIVE DIAGNOSIS:  right index stenosing tenosynovitis  POST-OPERATIVE DIAGNOSIS:  right index stenosing tenosynovitis  PROCEDURE:  Procedure(s) (LRB): RELEASE TRIGGER FINGER/A-1 PULLEY right index finger  SURGEON:  Surgeon(s) and Role:    * Wyn Forster., MD - Primary  PHYSICIAN ASSISTANT:   ASSISTANTS: Surgical assistant  ANESTHESIA:   IV sedation  EBL:  Total I/O In: 400 [I.V.:400] Out: -   BLOOD ADMINISTERED:none  DRAINS: none   LOCAL MEDICATIONS USED:  XYLOCAINE   SPECIMEN:  No Specimen  DISPOSITION OF SPECIMEN:  N/A  COUNTS:  YES  TOURNIQUET:   Total Tourniquet Time Documented: Upper Arm (Right) - 8 minutes  DICTATION: .Other Dictation: Dictation Number [id number]  PLAN OF CARE: Discharge to home after PACU  PATIENT DISPOSITION:  PACU - hemodynamically stable.

## 2012-02-23 ENCOUNTER — Encounter (HOSPITAL_BASED_OUTPATIENT_CLINIC_OR_DEPARTMENT_OTHER): Payer: Self-pay | Admitting: Orthopedic Surgery

## 2012-02-23 NOTE — Op Note (Signed)
NAMEBRANDI, ARMATO                 ACCOUNT NO.:  [id number]  MEDICAL RECORD NO.:  [id number]  LOCATION:                               FACILITY:  [facility]  PHYSICIAN:  Katy Fitch. Phyllip Claw, M.D.      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  OPERATION:  Release of right index finger A1 pulley.  OPERATING SURGEON:  Katy Fitch. Amberlyn Martinezgarcia, MD  ASSISTANT:  Surgical assistant.  ANESTHESIA:  2% lidocaine, metacarpal head level block and flexor sheath block supplemented by IV sedation.  SUPERVISING ANESTHESIOLOGIST:  Janetta Hora. Gelene Mink, MD  INDICATIONS:  Trevor Huffman is a 78 year old right-hand dominant retired Advertising copywriter who has had chronic stenosing tenosynovitis of his right index finger.  This is interfered with his ability to play golf comfortably.  He has failed nonoperative measures.  The recently presented to the office for evaluation and management of this predicament.  We advised him to proceed with release of his right index finger A1 pulley.  PROCEDURE:  Azari Hasler was brought to room #6 of the [facility] and placed in supine position on the operating table.  Following IV sedation under Dr. Thornton Dales supervision, the right hand and arm were prepped with Betadine soap and solution, sterilely draped. A pneumatic tourniquet was applied to the proximal right brachium.  Following exsanguination of the right arm with Esmarch bandage, arterial tourniquet was inflated to 220 mmHg.  Procedure commenced the routine surgical time-out.  An oblique incision was fashioned directly over the palpably thickened flexor sheath. Subcutaneous tissues were carefully divided revealing early  palmar fibromatosis, that was gently released.  The radial neurovascular bundle was isolated and gently retracted.  The A1 pulley was noted to be very thickened and surrounded by a cuff of inflammatory tenosynovium.  The tenosynovium was removed with scissors dissection followed by release of the A1 pulley with scissors.  Mr. Cuffee demonstrated full active range of motion of the fingers.  The tendons were delivered and further fibrotic tenosynovium removed.  The wound was then repaired with intradermal 3-0 Prolene and a Steri-Strip. A compressive dressing was applied with sterile gauze and Ace wrap. There were no apparent complications.  For aftercare, Mr. Helminiak will begin active range of motion exercises. He will return to my office in 1 week for suture removal.  He may return to playing golf in 10 days.     Katy Fitch Kieanna Rollo, M.D.     RVS/MEDQ  D:  02/22/2012  T:  02/22/2012  Job:  [id number]  cc:   Rosalyn Gess. Norins, MD

## 2012-02-26 ENCOUNTER — Encounter (HOSPITAL_COMMUNITY): Payer: Self-pay | Admitting: Emergency Medicine

## 2012-02-26 ENCOUNTER — Emergency Department (HOSPITAL_COMMUNITY): Payer: Medicare Other

## 2012-02-26 ENCOUNTER — Emergency Department (HOSPITAL_COMMUNITY)
Admission: EM | Admit: 2012-02-26 | Discharge: 2012-02-26 | Disposition: A | Discharge status: Home / Self Care | Payer: Medicare Other | Attending: Emergency Medicine | Admitting: Emergency Medicine

## 2012-02-26 DIAGNOSIS — I2510 Atherosclerotic heart disease of native coronary artery without angina pectoris: Secondary | ICD-10-CM | POA: Insufficient documentation

## 2012-02-26 DIAGNOSIS — M25519 Pain in unspecified shoulder: Secondary | ICD-10-CM | POA: Insufficient documentation

## 2012-02-26 DIAGNOSIS — M25529 Pain in unspecified elbow: Secondary | ICD-10-CM | POA: Insufficient documentation

## 2012-02-26 DIAGNOSIS — Z7982 Long term (current) use of aspirin: Secondary | ICD-10-CM | POA: Insufficient documentation

## 2012-02-26 DIAGNOSIS — Z79899 Other long term (current) drug therapy: Secondary | ICD-10-CM | POA: Insufficient documentation

## 2012-02-26 DIAGNOSIS — Z951 Presence of aortocoronary bypass graft: Secondary | ICD-10-CM | POA: Insufficient documentation

## 2012-02-26 DIAGNOSIS — I10 Essential (primary) hypertension: Secondary | ICD-10-CM | POA: Insufficient documentation

## 2012-02-26 DIAGNOSIS — K219 Gastro-esophageal reflux disease without esophagitis: Secondary | ICD-10-CM | POA: Insufficient documentation

## 2012-02-26 NOTE — ED Notes (Signed)
Pt c/o left arm pain. Pt stated "my wife was mopping and I slipped and fell" Pt denies head injury.

## 2012-02-26 NOTE — ED Provider Notes (Signed)
History     CSN: [id number]  Arrival date & time 02/26/12  1243   First MD Initiated Contact with Patient 02/26/12 1543      Chief Complaint  Patient presents with  . Fall  . Shoulder Pain    (Consider location/radiation/quality/duration/timing/severity/associated sxs/prior treatment) Patient is a 79 y.o. male presenting with fall and shoulder pain.  Fall  Shoulder Pain  Patient complaining of pain left upper extremity after fall at home.  Patient states occurred 4-5 hours ago after slipping on water.  Past Medical History  Diagnosis Date  . CAD (coronary artery disease)   . HTN (hypertension)   . Dementia   . Sprain and strain of other specified sites of knee and leg   . Lipoma   . Trigger finger   . DDD (degenerative disc disease), lumbosacral   . HLD (hyperlipidemia)   . Cluster headache   . Nephrolithiasis   . Hypertrophy of prostate without urinary obstruction and other lower urinary tract symptoms (LUTS)   . GERD (gastroesophageal reflux disease)   . AAA (abdominal aortic aneurysm)     Past Surgical History  Procedure Date  . Abdominal aortic aneurysm repair   . Nephrostomy / nephrotomy   . Inguinal hernia repair   . Coronary artery bypass graft   . Coronary stent placement   . Prostatectomy   . Ventral hernia repair   . Trigger finger release 02/22/2012    Procedure: RELEASE TRIGGER FINGER/A-1 PULLEY;  Surgeon: Wyn Forster., MD;  Location: [facility];  Service: Orthopedics;  Laterality: Right;  right index    Family History  Problem Relation Age of Onset  . Stroke    . Lung cancer    . Cancer Father     lung     History  Substance Use Topics  . Smoking status: Former Smoker    Types: Cigarettes    Quit date: 08/10/1995  . Smokeless tobacco: Never Used  . Alcohol Use: Yes     once a month      Review of Systems  Allergies  Review of patient's allergies indicates no known allergies.  Home Medications   Current  Outpatient Rx  Name Route Sig Dispense Refill  . ALPRAZOLAM 0.5 MG PO TABS Oral Take 1 tablet (0.5 mg total) by mouth every 4 (four) hours as needed for sleep. Take every 4 hours as needed for stress and agitation. 60 tablet 1  . ASPIRIN 81 MG PO TABS Oral Take 81 mg by mouth daily.      . DONEPEZIL HCL 5 MG PO TABS Oral Take 5 mg by mouth at bedtime as needed.      Marland Kitchen EZETIMIBE-SIMVASTATIN 10-40 MG PO TABS Oral Take 1 tablet by mouth at bedtime.      Marland Kitchen METOPROLOL SUCCINATE ER 50 MG PO TB24 Oral Take 50 mg by mouth daily.      Marland Kitchen PANTOPRAZOLE SODIUM 40 MG PO TBEC Oral Take 40 mg by mouth daily.      Marland Kitchen RAMIPRIL 5 MG PO CAPS  TAKE 1 CAPSULE BY MOUTH EVERY DAY 30 capsule 5    BP 141/105  Pulse 61  Temp 98.6 F (37 C) (Oral)  SpO2 99%  Physical Exam  Nursing note and vitals reviewed. Constitutional: He appears well-developed and well-nourished.  HENT:  Head: Normocephalic and atraumatic.  Right Ear: External ear normal.  Left Ear: External ear normal.  Nose: Nose normal.  Mouth/Throat: Oropharynx is clear and moist.  Eyes: Conjunctivae and EOM are normal. Pupils are equal, round, and reactive to light.  Neck: Normal range of motion. Neck supple.  Cardiovascular: Normal rate and regular rhythm.   Pulmonary/Chest: Effort normal and breath sounds normal.  Abdominal: Soft. Bowel sounds are normal.  Musculoskeletal:       Abrasion right elbow, no tenderness to palpation, full arom right elbow and right shoulder.  Diffuse tenderness left elbow, full arom left elbow and left shoulder.  Neurovascularly intact throughout extremities.   Neurological: He is alert.       Patient not oriented to date or president.  Oriented top place and person.   Skin: Skin is warm and dry.  Psychiatric: His mood appears anxious.    ED Course  Procedures (including critical care time)  Labs Reviewed - No data to display Dg Shoulder Left  02/26/2012  *RADIOLOGY REPORT*  Clinical Data: History of fall  complaining of left-sided shoulder pain.  LEFT SHOULDER - 2+ VIEW  Comparison: No priors.  Findings: Three views of the left shoulder demonstrate no acute fracture, subluxation, dislocation, joint or soft tissue abnormality.  Atherosclerosis of the thoracic aorta.  Postoperative changes of median sternotomy for CABG with LIMA.  IMPRESSION: 1.  No acute radiographic abnormality of the left shoulder.  Original Report Authenticated By: Florencia Reasons, M.D.     No diagnosis found.  Dg Elbow Complete Left  02/26/2012  *RADIOLOGY REPORT*  Clinical Data: History of fall complaining of left elbow pain.  LEFT ELBOW - COMPLETE 3+ VIEW  Comparison: No priors.  Findings: Four views of the left elbow demonstrate no acute fracture, subluxation, dislocation, joint or soft tissue abnormality.  IMPRESSION: 1.  No acute radiographic abnormality of the left elbow.  Original Report Authenticated By: Florencia Reasons, M.D.   Dg Elbow Complete Right  02/26/2012  *RADIOLOGY REPORT*  Clinical Data: History of fall complaining of right elbow pain.  RIGHT ELBOW - COMPLETE 3+ VIEW  Comparison: No priors.  Findings: Four views of the right elbow demonstrate no acute fracture, subluxation, dislocation, joint or soft tissue abnormality.  IMPRESSION: 1.  No acute radiographic abnormality of the right elbow.  Original Report Authenticated By: Florencia Reasons, M.D.   Dg Shoulder Left  02/26/2012  *RADIOLOGY REPORT*  Clinical Data: History of fall complaining of left-sided shoulder pain.  LEFT SHOULDER - 2+ VIEW  Comparison: No priors.  Findings: Three views of the left shoulder demonstrate no acute fracture, subluxation, dislocation, joint or soft tissue abnormality.  Atherosclerosis of the thoracic aorta.  Postoperative changes of median sternotomy for CABG with LIMA.  IMPRESSION: 1.  No acute radiographic abnormality of the left shoulder.  Original Report Authenticated By: Florencia Reasons, M.D.    MDM    Patient  with elbow pain after fall.  Plan sling and follow up with Dr.Sypher.       Hilario Quarry, MD 02/26/12 [id number]

## 2012-02-26 NOTE — ED Notes (Signed)
Attempting to leave AMA "[place] to go to [facility], just to get something done-I am hurting so bad." when asked if he had an xray yet, stated- "no one has done anything yet" Has had xray already, explained that to patient, has small abrasion to right elbow, bleeding controlled. Stated will stay, requesting to see an orthopedist.

## 2012-02-26 NOTE — ED Notes (Signed)
Pt tripped and fell and landed on left side. Pt c/o left shoulder pain, denies any additional injuries. Denies loss of consciousness.

## 2012-02-28 ENCOUNTER — Emergency Department (HOSPITAL_COMMUNITY)
Admission: EM | Admit: 2012-02-28 | Discharge: 2012-02-28 | Disposition: A | Discharge status: Home / Self Care | Payer: Medicare Other

## 2012-02-28 NOTE — ED Notes (Signed)
Pt stated he did not want to see an ER dr, would like to see an Orthopedic dr and would go follow up with them instead of ER.

## 2012-03-06 ENCOUNTER — Encounter: Payer: Medicare Other | Admitting: Cardiology

## 2012-03-30 ENCOUNTER — Encounter: Payer: Self-pay | Admitting: Cardiology

## 2012-03-30 ENCOUNTER — Ambulatory Visit (INDEPENDENT_AMBULATORY_CARE_PROVIDER_SITE_OTHER): Payer: Medicare Other | Admitting: Cardiology

## 2012-03-30 VITALS — BP 140/80 | HR 56 | Ht 68.0 in | Wt 169.0 lb

## 2012-03-30 DIAGNOSIS — Z9889 Other specified postprocedural states: Secondary | ICD-10-CM

## 2012-03-30 DIAGNOSIS — F068 Other specified mental disorders due to known physiological condition: Secondary | ICD-10-CM

## 2012-03-30 DIAGNOSIS — E785 Hyperlipidemia, unspecified: Secondary | ICD-10-CM

## 2012-03-30 DIAGNOSIS — I2510 Atherosclerotic heart disease of native coronary artery without angina pectoris: Secondary | ICD-10-CM

## 2012-03-30 LAB — LIPID PANEL
HDL: 48.8 mg/dL (ref >39.00)
Total CHOL/HDL Ratio: 3
Triglycerides: 105 mg/dL (ref 0.0–149.0)
VLDL: 21 mg/dL (ref 0.0–40.0)

## 2012-03-30 NOTE — Patient Instructions (Addendum)
Your physician recommends that you have  a FASTING lipid profile today.   Your physician wants you to follow-up in: 1 year with Dr Shirlee Latch. (August 2014). You will receive a reminder letter in the mail two months in advance. If you don't receive a letter, please call our office to schedule the follow-up appointment.

## 2012-03-31 DIAGNOSIS — E785 Hyperlipidemia, unspecified: Secondary | ICD-10-CM | POA: Insufficient documentation

## 2012-03-31 NOTE — Assessment & Plan Note (Signed)
Check lipids.  Goal LDL <70 

## 2012-03-31 NOTE — Assessment & Plan Note (Signed)
No ischemic symptoms.  Continue ASA 81, Vytorin, ACEI, and beta blocker.

## 2012-03-31 NOTE — Progress Notes (Signed)
Patient ID: Trevor Huffman, male   DOB: [birth year], 78 y.o.   MRN: [id number] PCP: Dr. Debby Bud  78 yo with history of CAD s/p CABG and AAA s/p stent grafting presents for cardiology followup.  He has been seen by Dr. Gala Romney in the past.  He spends 6 months of the year in [place] and has a vascular surgeon and cardiologist down there.  Last procedure was BMS to SVG-D in 4/09.  In general, it seems like he is doing well.  History-taking is a bit difficult as he repeats himself very frequently.  No exertional chest pain or dyspnea.  He still plays golf.  No tachypalpitations or syncope.   Labs (7/13): K 4.4, creatinine 0.89   PMH: 1. CAD: CABG in 2000 after multiple PCIs.  In 4/09, he had BMS to SVG-diagonal.   2. Hyperlipidemia 3. AAA with stent graft placement.  4. Nephrolithiasis.  5. Hyperlipidemia 6. Dementia  SH: Married, lives 6 months in [place] and 6 months in [place].  Retired, owned a Tax inspector.  Nonsmoker.  3 sons.    FH: No premature CAD.   ROS: All systems reviewed and negative except as per HPI.   Current Outpatient Prescriptions  Medication Sig Dispense Refill  . aspirin 81 MG tablet Take 81 mg by mouth daily.        Marland Kitchen donepezil (ARICEPT) 5 MG tablet Take 5 mg by mouth at bedtime as needed.        . ezetimibe-simvastatin (VYTORIN) 10-40 MG per tablet Take 1 tablet by mouth at bedtime.        . metoprolol (TOPROL-XL) 50 MG 24 hr tablet Take 50 mg by mouth daily.        . pantoprazole (PROTONIX) 40 MG tablet Take 40 mg by mouth daily.        . ramipril (ALTACE) 5 MG capsule TAKE 1 CAPSULE BY MOUTH EVERY DAY  30 capsule  5    BP 140/80  Pulse 56  Ht 5\' 8"  (1.727 m)  Wt 169 lb (76.658 kg)  BMI 25.70 kg/m2  SpO2 91% General: NAD Neck: No JVD, no thyromegaly or thyroid nodule.  Lungs: Clear to auscultation bilaterally with normal respiratory effort. CV: Nondisplaced PMI.  Heart regular S1/S2, no S3/S4, no murmur.  No peripheral edema.  No  carotid bruit.  Normal pedal pulses.  Abdomen: Soft, nontender, no hepatosplenomegaly, no distention.  Neurologic: Alert and oriented x 3.  Poor short term memory.  Psych: Normal affect. Extremities: No clubbing or cyanosis.

## 2012-03-31 NOTE — Assessment & Plan Note (Signed)
Short term memory difficulty with frequent repetition.

## 2012-03-31 NOTE — Assessment & Plan Note (Signed)
S/p AAA stent graft.  Patient is followed by vascular surgeon in [place].

## 2012-04-03 ENCOUNTER — Other Ambulatory Visit: Payer: Self-pay | Admitting: *Deleted

## 2012-04-03 DIAGNOSIS — I2510 Atherosclerotic heart disease of native coronary artery without angina pectoris: Secondary | ICD-10-CM

## 2012-04-03 DIAGNOSIS — E7800 Pure hypercholesterolemia, unspecified: Secondary | ICD-10-CM

## 2012-04-03 MED ORDER — ATORVASTATIN CALCIUM 80 MG PO TABS: 80.0000 mg | ORAL_TABLET | Freq: Every day | ORAL | Status: DC

## 2012-04-06 ENCOUNTER — Encounter: Payer: Self-pay | Admitting: Internal Medicine

## 2012-04-06 ENCOUNTER — Ambulatory Visit (INDEPENDENT_AMBULATORY_CARE_PROVIDER_SITE_OTHER): Payer: Medicare Other | Admitting: Internal Medicine

## 2012-04-06 VITALS — BP 124/70 | HR 54 | Temp 98.5°F | Resp 16 | Wt 171.0 lb

## 2012-04-06 DIAGNOSIS — F068 Other specified mental disorders due to known physiological condition: Secondary | ICD-10-CM

## 2012-04-06 DIAGNOSIS — E785 Hyperlipidemia, unspecified: Secondary | ICD-10-CM

## 2012-04-06 NOTE — Assessment & Plan Note (Signed)
Pateint not at goal with LDL - 100.  Plan - per Dr. Shirlee Latch - change to lipitor 80 mg daily  Follow-up lab October 9  This was presented in the AVS to MR. Jenne Pane

## 2012-04-06 NOTE — Progress Notes (Signed)
  Subjective:    Patient ID: Trevor Huffman, male    DOB: 02/29/1930, 78 y.o.   MRN: [id number]  HPI Mr. Kanouse is here to say hello. He says he just needs to be checked. He is confused about his medication - did not remember that his vytorin had been stopped. Reviewed the chart - he has seen Dr. Shirlee Latch Aug 22 - lab revealed LDL 100, goal is 80 or less. Lipitor 80 was called in but he hasn't picked it up.  He continues to repeat himself incessantly.  PMH, FamHx and SocHx reviewed for any changes and relevance.  Current Outpatient Prescriptions on File Prior to Visit  Medication Sig Dispense Refill  . aspirin 81 MG tablet Take 81 mg by mouth daily.        Marland Kitchen donepezil (ARICEPT) 5 MG tablet Take 5 mg by mouth at bedtime as needed.        . metoprolol (TOPROL-XL) 50 MG 24 hr tablet Take 50 mg by mouth daily.        . pantoprazole (PROTONIX) 40 MG tablet Take 40 mg by mouth daily.        . ramipril (ALTACE) 5 MG capsule TAKE 1 CAPSULE BY MOUTH EVERY DAY  30 capsule  5  . atorvastatin (LIPITOR) 80 MG tablet Take 1 tablet (80 mg total) by mouth daily.  30 tablet  3      Review of Systems System review is negative for any constitutional, cardiac, pulmonary, GI or neuro symptoms or complaints other than as described in the HPI.     Objective:   Physical Exam Filed Vitals:   04/06/12 1104  BP: 124/70  Pulse: 54  Temp: 98.5 F (36.9 C)  Resp: 16   Wt Readings from Last 3 Encounters:  04/06/12 171 lb (77.565 kg)  03/30/12 169 lb (76.658 kg)  02/22/12 175 lb (79.379 kg)   Gen'l- WNWD whie man looking younger than his age Cor - RRR Pulm - normal        Assessment & Plan:

## 2012-04-06 NOTE — Assessment & Plan Note (Signed)
No change in his condition. Seems to remain very functional.

## 2012-04-06 NOTE — Patient Instructions (Addendum)
1. Cholesterol management - your last lipid panel August 22nd revealed an LDL = 100. Dr. Shirlee Latch wants the LDL to be less than 80. He changed your prescription from Vytorin ( stop taking this) to Atorvastatin (lipitor) 80 mg once a day. You are to have follow-up lab work October 9th to recheck the cholesterol. PICK UP THE NEW PRESCRIPTION AT [facility] [address].  You seem to be doing very well. Keep playing lots of golf - the goal is to shoot your age.

## 2012-04-18 ENCOUNTER — Ambulatory Visit (INDEPENDENT_AMBULATORY_CARE_PROVIDER_SITE_OTHER): Payer: Medicare Other | Admitting: Internal Medicine

## 2012-04-18 ENCOUNTER — Encounter: Payer: Self-pay | Admitting: Internal Medicine

## 2012-04-18 VITALS — BP 118/72 | HR 61 | Temp 98.0°F | Resp 16 | Wt 171.0 lb

## 2012-04-18 DIAGNOSIS — I872 Venous insufficiency (chronic) (peripheral): Secondary | ICD-10-CM

## 2012-04-18 NOTE — Patient Instructions (Addendum)
The swelling in foot is due to venous insufficiency. I have attached some literature on the topic to this summary.  For the swelling in your foot, you can elevate your feet when you are sitting. If you plan on standing for a long period of time, you can use compression stockings or wrap your foot with an ACE bandage.  Venous Stasis and Chronic Venous Insufficiency As people age, the veins located in their legs may weaken and stretch. When veins weaken and lose the ability to pump blood effectively, the condition is called chronic venous insufficiency (CVI) or venous stasis. Almost all veins return blood back to the heart. This happens by:  The force of the heart pumping fresh blood pushes blood back to the heart.   Blood flowing to the heart from the force of gravity.  In the deep veins of the legs, blood has to fight gravity and flow upstream back to the heart. Here, the leg muscles contract to pump blood back toward the heart. Vein walls are elastic, and many veins have small valves that only allow blood to flow in one direction. When leg muscles contract, they push inward against the elastic vein walls. This squeezes blood upward, opens the valves, and moves blood toward the heart. When leg muscles relax, the vein wall also relaxes and the valves inside the vein close to prevent blood from flowing backward. This method of pumping blood out of the legs is called the venous pump. CAUSES   The venous pump works best while walking and leg muscles are contracting. But when a person sits or stands, blood pressure in leg veins can build. Deep veins are usually able to withstand short periods of inactivity, but long periods of inactivity (and increased pressure) can stretch, weaken, and damage vein walls. High blood pressure can also stretch and damage vein walls. The veins may no longer be able to pump blood back to the heart. Venous hypertension (high blood pressure inside veins) that lasts over time is a  primary cause of CVI. CVI can also be caused by:    Deep vein thrombosis, a condition where a thrombus (blood clot) blocks blood flow in a vein.   Phlebitis, an inflammation of a superficial vein that causes a blood clot to form.  Other risk factors for CVI may include:    Heredity.   Obesity.   Pregnancy.   Sedentary lifestyle.   Smoking.   Jobs requiring long periods of standing or sitting in one place.   Age and gender:   Women in their 57's and 44's and men in their 33's are more prone to developing CVI.  SYMPTOMS   Symptoms of CVI may include:    Varicose veins.   Ulceration or skin breakdown.   Lipodermatosclerosis, a condition that affects the skin just above the ankle, usually on the inside surface.  Over time the skin becomes brown, smooth, tight and often painful. Those with this condition have a high risk of developing skin ulcers.   Reddened or discolored skin on the leg.   Swelling.  DIAGNOSIS   Your caregiver can diagnose CVI after performing a careful medical history and physical examination. To confirm the diagnosis, the following tests may also be ordered:    Duplex ultrasound.   Plethysmography (tests blood flow).   Venograms (x-ray using a special dye).  TREATMENT The goals of treatment for CVI are to restore a person to an active life and to minimize pain or disability. Typically, CVI  does not pose a serious threat to life or limb, and with proper treatment most people with this condition can continue to lead active lives. In most cases, mild CVI can be treated on an outpatient basis with simple procedures. Treatment methods include:    Elastic compression socks.   Sclerotherapy, a procedure involving an injection of a material that "dissolves" the damaged veins. Other veins in the network of blood vessels take over the function of the damaged veins.   Vein stripping (an older procedure less commonly used).   Laser Ablation surgery.   Valve  repair.  HOME CARE INSTRUCTIONS    Elastic compression socks must be worn every day. They can help with symptoms and lower the chances of the problem getting worse, but they do not cure the problem.   Only take over-the-counter or prescription medicines for pain, discomfort, or fever as directed by your caregiver.   Your caregiver will review your other medications with you.  SEEK MEDICAL CARE IF:    You are confused about how to take your medications.   There is redness, swelling, or increasing pain in the affected area.   There is a red streak or line that extends up or down from the affected area.   There is a breakdown or loss of skin in the affected area, even if the breakdown is small.   You develop an unexplained oral temperature above 102 F (38.9 C).   There is an injury to the affected area.  SEEK IMMEDIATE MEDICAL CARE IF:    There is an injury and open wound to the affected area.   Pain is not adequately relieved with pain medication prescribed or becomes severe.   An oral temperature above 102 F (38.9 C) develops.   The foot/ankle below the affected area becomes suddenly numb or the area feels weak and hard to move.  MAKE SURE YOU:    Understand these instructions.   Will watch your condition.   Will get help right away if you are not doing well or get worse.  Document Released: 11/29/2006 Document Revised: 07/15/2011 Document Reviewed: 02/06/2007 [facility] Patient Information 2012 [place], [place].

## 2012-04-18 NOTE — Progress Notes (Signed)
Subjective:     Patient ID: Trevor Huffman, male   DOB: May 21, 1930, 78 y.o.   MRN: [id number]  HPI Comments: Mr. Cardarelli is a 78 yo male who has come to the clinic today for swelling in his right foot. This swelling has been a chronic issue for him, but he has come here today seeking treatment because it has been bothering him recently. This episode of swelling began a week ago and is accompanied by a feeling of pressure. He does not report pain or numbness in his right foot or leg. He reports that there has been no trauma to his right leg recently. He plays gold frequently when he goes to [place].  Past Medical History  Diagnosis Date  . CAD (coronary artery disease)   . HTN (hypertension)   . Dementia   . Sprain and strain of other specified sites of knee and leg   . Lipoma   . Trigger finger   . DDD (degenerative disc disease), lumbosacral   . HLD (hyperlipidemia)   . Cluster headache   . Nephrolithiasis   . Hypertrophy of prostate without urinary obstruction and other lower urinary tract symptoms (LUTS)   . GERD (gastroesophageal reflux disease)   . AAA (abdominal aortic aneurysm)    Past Surgical History  Procedure Date  . Abdominal aortic aneurysm repair   . Nephrostomy / nephrotomy   . Inguinal hernia repair   . Coronary artery bypass graft   . Coronary stent placement   . Prostatectomy   . Ventral hernia repair   . Trigger finger release 02/22/2012    Procedure: RELEASE TRIGGER FINGER/A-1 PULLEY;  Surgeon: Wyn Forster., MD;  Location: [facility];  Service: Orthopedics;  Laterality: Right;  right index   Family History  Problem Relation Age of Onset  . Stroke    . Lung cancer    . Cancer Father     lung    History   Social History  . Marital Status: Married    Spouse Name: N/A    Number of Children: 3  . Years of Education: N/A   Occupational History  . retired    Social History Main Topics  . Smoking status: Former Smoker    Types:  Cigarettes    Quit date: 08/10/1995  . Smokeless tobacco: Never Used  . Alcohol Use: Yes     once a month  . Drug Use: No  . Sexually Active: Not on file   Other Topics Concern  . Not on file   Social History Narrative   HSG, no college. Married. Had a daughter - killed in an MVA. 3 sons - one a physician. Lives with his wife and is independent in all ADLs. Retired - Multimedia programmer business.Physician Roster:  Charolotte Capuchin: cardiology - Dr. Virgina Evener [address], [place] [address]; IM - Dr. Judithann Sauger, [email or web address], [phone]; Vascular surg - Dr. Felipa Eth, [facility], [place], [place]. [address], [place], [place] [address]; Neurology - Renda Rolls, [address] [place]  [address], [phone]    Current Outpatient Prescriptions on File Prior to Visit  Medication Sig Dispense Refill  . aspirin 81 MG tablet Take 81 mg by mouth daily.        Marland Kitchen atorvastatin (LIPITOR) 80 MG tablet Take 1 tablet (80 mg total) by mouth daily.  30 tablet  3  . famotidine (PEPCID) 20 MG tablet Take  20 mg by mouth 2 (two) times daily.      . metoprolol (TOPROL-XL) 50 MG 24 hr tablet Take 50 mg by mouth daily.        . pantoprazole (PROTONIX) 40 MG tablet Take 40 mg by mouth daily.        . ramipril (ALTACE) 5 MG capsule TAKE 1 CAPSULE BY MOUTH EVERY DAY  30 capsule  5     Review of Systems  All other systems reviewed and are negative.       Objective:   Physical Exam  Nursing note and vitals reviewed. Constitutional: He is oriented to person, place, and time. No distress.  HENT:  Head: Atraumatic.  Cardiovascular: Normal rate, regular rhythm, normal heart sounds and intact distal pulses.  Exam reveals no gallop and no friction rub.   No murmur heard. Pulmonary/Chest: Effort normal and breath sounds normal. No respiratory distress. He has no wheezes. He has no rales.  Abdominal: Soft. Bowel sounds are normal. He  exhibits no distension. There is no tenderness.  Musculoskeletal: Normal range of motion. He exhibits edema (1+ Pitting edema in right foot extending halfway up the right leg.). He exhibits no tenderness.  Neurological: He is alert and oriented to person, place, and time.  Skin:       No lesions or signs of trauma on his right foot or leg.  Psychiatric: He has a normal mood and affect. Judgment normal.   Filed Vitals:   04/18/12 1142  BP: 118/72  Pulse: 61  Temp: 98 F (36.7 C)  Resp: 16        Assessment & Plan:     1. Venous Insufficiency - This is likely venous insufficiency given the patient's normal kidney function and heart function. He spends long periods of time standing when he plays golf which further fortifies the diagnosis.  PLAN - educate patient about compression stocking use and ACE bandage use. Provide  literature about venous insufficiency and brief patient about the nature of this condition.    Attending note - patient interviewed and examined. Chart reviewed re: previous chemistries and cardiac testing. He continues to perseverate. Right foot with minimal swelling. There is a bony protrusion over the 1st metatarsal with mild erythema, no skin breakdown. Agree with Dx-mild venous insufficiency. Plan - elevation, men's support hose or ACE wrap if swelling is more bothersome

## 2012-04-21 ENCOUNTER — Telehealth: Payer: Self-pay | Admitting: Internal Medicine

## 2012-04-21 NOTE — Telephone Encounter (Signed)
Request refill of Panroprazole 40mg , please call into [facility] on [address], pt request a call once sent

## 2012-04-24 ENCOUNTER — Other Ambulatory Visit: Payer: Self-pay | Admitting: *Deleted

## 2012-04-24 MED ORDER — PANTOPRAZOLE SODIUM 40 MG PO TBEC: 40.0000 mg | DELAYED_RELEASE_TABLET | Freq: Every day | ORAL | Status: DC

## 2012-04-24 NOTE — Telephone Encounter (Signed)
Refill pantoprazole to [facility], [address]

## 2012-04-25 NOTE — Telephone Encounter (Signed)
Left message with wife, Aurea Graff, that Rx was sent to [facility]

## 2012-04-27 ENCOUNTER — Other Ambulatory Visit: Payer: Self-pay | Admitting: *Deleted

## 2012-05-02 NOTE — Progress Notes (Signed)
This encounter was created in error - please disregard.

## 2012-05-17 ENCOUNTER — Other Ambulatory Visit (INDEPENDENT_AMBULATORY_CARE_PROVIDER_SITE_OTHER): Payer: Medicare Other

## 2012-05-17 DIAGNOSIS — I2510 Atherosclerotic heart disease of native coronary artery without angina pectoris: Secondary | ICD-10-CM

## 2012-05-17 DIAGNOSIS — E7800 Pure hypercholesterolemia, unspecified: Secondary | ICD-10-CM

## 2012-05-17 LAB — HEPATIC FUNCTION PANEL
ALT: 17 U/L (ref 0–53)
Alkaline Phosphatase: 78 U/L (ref 39–117)
Bilirubin, Direct: 0.2 mg/dL (ref 0.0–0.3)
Total Bilirubin: 1 mg/dL (ref 0.3–1.2)

## 2012-05-17 LAB — LIPID PANEL: VLDL: 20.6 mg/dL (ref 0.0–40.0)

## 2012-05-23 ENCOUNTER — Other Ambulatory Visit: Payer: Self-pay | Admitting: Cardiology

## 2012-06-08 ENCOUNTER — Encounter: Payer: Self-pay | Admitting: *Deleted

## 2012-09-20 ENCOUNTER — Other Ambulatory Visit: Payer: Self-pay | Admitting: Cardiology

## 2012-09-25 ENCOUNTER — Other Ambulatory Visit: Payer: Self-pay | Admitting: Cardiology

## 2012-09-25 ENCOUNTER — Other Ambulatory Visit: Payer: Self-pay | Admitting: *Deleted

## 2012-09-25 MED ORDER — ATORVASTATIN CALCIUM 80 MG PO TABS: 80.0000 mg | ORAL_TABLET | Freq: Every day | ORAL | Status: AC

## 2012-12-11 ENCOUNTER — Telehealth: Payer: Self-pay | Admitting: Neurology

## 2012-12-13 ENCOUNTER — Telehealth: Payer: Self-pay | Admitting: Neurology

## 2012-12-14 ENCOUNTER — Telehealth: Payer: Self-pay | Admitting: *Deleted

## 2012-12-14 NOTE — Telephone Encounter (Signed)
Will call the office in June for appointment.

## 2012-12-29 ENCOUNTER — Telehealth: Payer: Self-pay | Admitting: Neurology

## 2012-12-30 ENCOUNTER — Other Ambulatory Visit: Payer: Self-pay | Admitting: Internal Medicine

## 2013-02-08 ENCOUNTER — Other Ambulatory Visit: Payer: Self-pay

## 2013-02-08 MED ORDER — RAMIPRIL 5 MG PO CAPS: 5.0000 mg | ORAL_CAPSULE | Freq: Every day | ORAL | Status: AC

## 2013-03-05 ENCOUNTER — Telehealth: Payer: Self-pay | Admitting: Internal Medicine

## 2013-03-05 NOTE — Telephone Encounter (Signed)
rec'd records from [place].forward 15 pages to Dr.Norins

## 2013-03-22 ENCOUNTER — Emergency Department (HOSPITAL_COMMUNITY)
Admission: EM | Admit: 2013-03-22 | Discharge: 2013-03-22 | Disposition: A | Discharge status: Home / Self Care | Payer: Medicare Other | Attending: Emergency Medicine | Admitting: Emergency Medicine

## 2013-03-22 ENCOUNTER — Encounter (HOSPITAL_COMMUNITY): Payer: Self-pay | Admitting: Emergency Medicine

## 2013-03-22 ENCOUNTER — Ambulatory Visit (INDEPENDENT_AMBULATORY_CARE_PROVIDER_SITE_OTHER): Payer: Medicare Other | Admitting: Internal Medicine

## 2013-03-22 VITALS — BP 120/76 | HR 49 | Temp 97.6°F | Wt 166.0 lb

## 2013-03-22 DIAGNOSIS — Z79899 Other long term (current) drug therapy: Secondary | ICD-10-CM | POA: Insufficient documentation

## 2013-03-22 DIAGNOSIS — Z9861 Coronary angioplasty status: Secondary | ICD-10-CM | POA: Insufficient documentation

## 2013-03-22 DIAGNOSIS — Z951 Presence of aortocoronary bypass graft: Secondary | ICD-10-CM | POA: Insufficient documentation

## 2013-03-22 DIAGNOSIS — Z85828 Personal history of other malignant neoplasm of skin: Secondary | ICD-10-CM | POA: Insufficient documentation

## 2013-03-22 DIAGNOSIS — K219 Gastro-esophageal reflux disease without esophagitis: Secondary | ICD-10-CM | POA: Insufficient documentation

## 2013-03-22 DIAGNOSIS — G521 Disorders of glossopharyngeal nerve: Secondary | ICD-10-CM

## 2013-03-22 DIAGNOSIS — R200 Anesthesia of skin: Secondary | ICD-10-CM

## 2013-03-22 DIAGNOSIS — F0390 Unspecified dementia without behavioral disturbance: Secondary | ICD-10-CM | POA: Insufficient documentation

## 2013-03-22 DIAGNOSIS — I2510 Atherosclerotic heart disease of native coronary artery without angina pectoris: Secondary | ICD-10-CM | POA: Insufficient documentation

## 2013-03-22 DIAGNOSIS — I10 Essential (primary) hypertension: Secondary | ICD-10-CM | POA: Insufficient documentation

## 2013-03-22 DIAGNOSIS — Z7982 Long term (current) use of aspirin: Secondary | ICD-10-CM | POA: Insufficient documentation

## 2013-03-22 DIAGNOSIS — K146 Glossodynia: Secondary | ICD-10-CM

## 2013-03-22 DIAGNOSIS — Z8739 Personal history of other diseases of the musculoskeletal system and connective tissue: Secondary | ICD-10-CM | POA: Insufficient documentation

## 2013-03-22 DIAGNOSIS — Z87448 Personal history of other diseases of urinary system: Secondary | ICD-10-CM | POA: Insufficient documentation

## 2013-03-22 DIAGNOSIS — E785 Hyperlipidemia, unspecified: Secondary | ICD-10-CM | POA: Insufficient documentation

## 2013-03-22 DIAGNOSIS — Z87442 Personal history of urinary calculi: Secondary | ICD-10-CM | POA: Insufficient documentation

## 2013-03-22 DIAGNOSIS — R209 Unspecified disturbances of skin sensation: Secondary | ICD-10-CM | POA: Insufficient documentation

## 2013-03-22 DIAGNOSIS — Z8679 Personal history of other diseases of the circulatory system: Secondary | ICD-10-CM | POA: Insufficient documentation

## 2013-03-22 DIAGNOSIS — Z87891 Personal history of nicotine dependence: Secondary | ICD-10-CM | POA: Insufficient documentation

## 2013-03-22 MED ORDER — LIDOCAINE VISCOUS 2 % MT SOLN
15.0000 mL | Freq: Once | OROMUCOSAL | Status: AC
  Administered 2013-03-22: 15 mL via OROMUCOSAL
  Filled 2013-03-22: qty 15

## 2013-03-22 NOTE — ED Provider Notes (Signed)
CSN: [id number]     Arrival date & time 03/22/13  0810 History     First MD Initiated Contact with Patient 03/22/13 [phone]     Chief Complaint  Patient presents with  . tongue pain   (Consider location/radiation/quality/duration/timing/severity/associated sxs/prior Treatment) HPI Comments: Pt comes in with cc of tongue pain. Pt has hx of HTN and other co morbidities, he is on ace. Comes in with burning to the tongue that started last night, and has persisted. No change in voice, taste, difficulty speaking. Pt has no hx of same. The pain is constant, with no aggravating or relieving factors.  The history is provided by the patient and medical records.    Past Medical History  Diagnosis Date  . CAD (coronary artery disease)   . HTN (hypertension)   . Dementia   . Sprain and strain of other specified sites of knee and leg   . Lipoma   . Trigger finger   . DDD (degenerative disc disease), lumbosacral   . HLD (hyperlipidemia)   . Cluster headache   . Nephrolithiasis   . Hypertrophy of prostate without urinary obstruction and other lower urinary tract symptoms (LUTS)   . GERD (gastroesophageal reflux disease)   . AAA (abdominal aortic aneurysm)    Past Surgical History  Procedure Laterality Date  . Abdominal aortic aneurysm repair    . Nephrostomy / nephrotomy    . Inguinal hernia repair    . Coronary artery bypass graft    . Coronary stent placement    . Prostatectomy    . Ventral hernia repair    . Trigger finger release  02/22/2012    Procedure: RELEASE TRIGGER FINGER/A-1 PULLEY;  Surgeon: Wyn Forster., MD;  Location: [facility];  Service: Orthopedics;  Laterality: Right;  right index   Family History  Problem Relation Age of Onset  . Stroke    . Lung cancer    . Cancer Father     lung    History  Substance Use Topics  . Smoking status: Former Smoker    Types: Cigarettes    Quit date: 08/10/1995  . Smokeless tobacco: Never Used  . Alcohol Use:  Yes     Comment: once a month    Review of Systems  Constitutional: Negative for activity change.  HENT: Negative for sore throat, mouth sores and trouble swallowing.   Allergic/Immunologic: Negative for environmental allergies and food allergies.    Allergies  Review of patient's allergies indicates no known allergies.  Home Medications   Current Outpatient Rx  Name  Route  Sig  Dispense  Refill  . aspirin EC 81 MG tablet   Oral   Take 81 mg by mouth daily.         Marland Kitchen atorvastatin (LIPITOR) 80 MG tablet   Oral   Take 1 tablet (80 mg total) by mouth daily.   30 tablet   5   . donepezil (ARICEPT) 10 MG tablet   Oral   Take 10 mg by mouth at bedtime.          . famotidine (PEPCID) 20 MG tablet   Oral   Take 20 mg by mouth 2 (two) times daily.         . metoprolol (TOPROL-XL) 50 MG 24 hr tablet   Oral   Take 50 mg by mouth daily.           . pantoprazole (PROTONIX) 40 MG tablet   Oral  Take 1 tablet (40 mg total) by mouth daily.   30 tablet   6   . ramipril (ALTACE) 5 MG capsule   Oral   Take 1 capsule (5 mg total) by mouth daily.   30 capsule   5    BP 175/79  Pulse 60  Temp(Src) 97.7 F (36.5 C) (Oral)  Resp 16  SpO2 100% Physical Exam  Nursing note and vitals reviewed. Constitutional: He appears well-developed.  HENT:  Head: Normocephalic.  Pt has some papules in the posterior tongue, symmetric, bilateral. No lymphadenopathy, no pustules, no dental pathology appreciated.  Eyes: Conjunctivae are normal.  Neck: Neck supple.  Cardiovascular: Normal rate and regular rhythm.   Pulmonary/Chest: Effort normal and breath sounds normal. No respiratory distress.  Abdominal: Soft.    ED Course   Procedures (including critical care time)  Labs Reviewed - No data to display No results found. 1. Numbness of tongue   2. Glossopharyngeal neuralgia     MDM  Pt comes in with tongue pain - burning, no oral pathology appreciated besides some  papules, which might be normal anatomy. No neuro deficits. Pt is on ACE, could be med side effect. Pt demented, wife is in [place] - so we called Dr. [facility] and set up an appt for 11 am, and patient advised to go their directly. Viscous lidocaine in the ED relieved the pain. No steroids or prescriptions provided. Doubt zoster.  Derwood Kaplan, MD 03/22/13 1002

## 2013-03-22 NOTE — ED Notes (Addendum)
Per pt, tongue feels like it is on fire-no new meds-thinks it started in middle of night-no swelling, no difficulty breathing

## 2013-03-22 NOTE — Progress Notes (Signed)
Subjective:     Patient ID: Trevor Huffman, male   DOB: 1930-05-03, 79 y.o.   MRN: [id number]  HPI Trevor Huffman is an 79 year-old gentleman here to follow up regarding his tongue pain.  Past medical history is significant for dementia.  He is not a reliable historian.  Pain is not bothering him any more.  He states that it started "a day or two" before his ER visit.  He describes the pain as soreness.  Denies myalgias.  No trouble swallowing or chewing.  No tinnitus or ear pain.  No precipitating or alleviating factors; pain spontaneously resolved.  Pain felt better when he left the ER.  Review of Systems  HENT: Negative for hearing loss, ear pain, mouth sores and tinnitus.   Respiratory: Negative for cough, choking, chest tightness and shortness of breath.   Cardiovascular: Negative for chest pain and palpitations.  Gastrointestinal: Negative for abdominal pain, diarrhea and constipation.       Objective:   Physical Exam  Constitutional: He appears well-developed and well-nourished. No distress.  HENT:  Head: Normocephalic and atraumatic.  Right Ear: External ear normal.  Left Ear: External ear normal.  Mouth/Throat: Normal dentition.  Papules on the bilateral posterior tongue;   Eyes: Conjunctivae and EOM are normal. Pupils are equal, round, and reactive to light.  Cardiovascular: Normal rate, regular rhythm and normal heart sounds.   Pulmonary/Chest: Effort normal and breath sounds normal. No respiratory distress. He has no wheezes. He has no rales. He exhibits no tenderness.  Lymphadenopathy:    He has no cervical adenopathy.  Neurological: He is alert.      Assessment:     The pain in Trevor Huffman' tongue seems to have spontaneously resolved. Follow up if it recurs

## 2013-03-22 NOTE — ED Notes (Signed)
States feeling of tongue on fire.  No lesions noted.  Tongue is red. States tongue discomfort is 7/10. Patient in no acute respiratory distress.

## 2013-04-12 ENCOUNTER — Other Ambulatory Visit: Payer: Self-pay | Admitting: Internal Medicine

## 2013-05-25 ENCOUNTER — Encounter: Payer: Self-pay | Admitting: Internal Medicine

## 2013-05-25 ENCOUNTER — Ambulatory Visit (INDEPENDENT_AMBULATORY_CARE_PROVIDER_SITE_OTHER): Payer: Medicare Other | Admitting: Internal Medicine

## 2013-05-25 VITALS — BP 144/82 | HR 52 | Temp 98.2°F

## 2013-05-25 DIAGNOSIS — B370 Candidal stomatitis: Secondary | ICD-10-CM

## 2013-05-25 MED ORDER — NYSTATIN 100000 UNIT/ML MT SUSP: 500000.0000 [IU] | Freq: Four times a day (QID) | OROMUCOSAL | Status: AC

## 2013-05-25 MED ORDER — NYSTATIN 100000 UNIT/ML MT SUSP: 500000.0000 [IU] | Freq: Four times a day (QID) | OROMUCOSAL | Status: DC

## 2013-05-25 NOTE — Progress Notes (Signed)
Pre-visit discussion using our clinic review tool. No additional management support is needed unless otherwise documented below in the visit note.  

## 2013-05-25 NOTE — Progress Notes (Signed)
  Subjective:    Patient ID: Trevor Huffman, male    DOB: Apr 27, 1930, 79 y.o.   MRN: [id number]  HPI  Complains of burning sensation in mouth, especially tongue Onset 2 weeks ago, intermittently present No painful swallowing  Past Medical History  Diagnosis Date  . CAD (coronary artery disease)   . HTN (hypertension)   . Dementia   . Sprain and strain of other specified sites of knee and leg   . Lipoma   . Trigger finger   . DDD (degenerative disc disease), lumbosacral   . HLD (hyperlipidemia)   . Cluster headache   . Nephrolithiasis   . Hypertrophy of prostate without urinary obstruction and other lower urinary tract symptoms (LUTS)   . GERD (gastroesophageal reflux disease)   . AAA (abdominal aortic aneurysm)     Review of Systems  Constitutional: Negative for fever, activity change and unexpected weight change.  Respiratory: Negative for cough and shortness of breath.   Cardiovascular: Negative for chest pain and leg swelling.  Gastrointestinal: Negative for abdominal pain.       Objective:   Physical Exam  BP 144/82  Pulse 52  Temp(Src) 98.2 F (36.8 C) (Oral)  SpO2 98% Wt Readings from Last 3 Encounters:  03/22/13 166 lb (75.297 kg)  04/18/12 171 lb (77.565 kg)  04/06/12 171 lb (77.565 kg)   Constitutional: he appears well-developed and well-nourished. No distress.  HENT: Head: Normocephalic and atraumatic. Mouth/Throat: red beefy tongue  Neck: Normal range of motion. Neck supple. No JVD present. No thyromegaly present.  Cardiovascular: Normal rate, regular rhythm and normal heart sounds.  No murmur heard. No BLE edema. Pulmonary/Chest: Effort normal and breath sounds normal. No respiratory distress. he has no wheezes.  Neurological: he is alert and oriented to person, place, and time. repetitive conversation. Coordination, balance, strength, speech and gait are normal.  Skin: Skin is warm and dry. No rash noted. No erythema.  Psychiatric: he has a normal mood  and affect. behavior is normal. Judgment and thought content normal.  Lab Results  Component Value Date   WBC 6.1 12/24/2009   HGB 14.4 02/22/2012   HCT 47.7 12/24/2009   PLT 173.0 12/24/2009   GLUCOSE 88 02/21/2012   CHOL 165 05/17/2012   TRIG 103.0 05/17/2012   HDL 42.70 05/17/2012   LDLCALC 102* 05/17/2012   ALT 17 05/17/2012   AST 20 05/17/2012   NA 143 02/21/2012   K 4.4 02/21/2012   CL 104 02/21/2012   CREATININE 0.89 02/21/2012   BUN 18 02/21/2012   CO2 29 02/21/2012   TSH 0.59 02/23/2010   INR 1.0 12/31/2008        Assessment & Plan:    Oral thrush -nystatin solution prescribed Education provided Patient to call if symptoms worse or unimproved

## 2013-05-25 NOTE — Patient Instructions (Addendum)
It was good to see you today.  Use prescription mouthwash 3-4x/day - Your prescription(s) have been submitted to your pharmacy. Please take as directed and contact our office if you believe you are having problem(s) with the medication(s).  No other medication changes  Thrush, Adult  Trevor Huffman is a yeast infection that develops in the mouth and throat and on the tongue. The medical term for this is oropharyngeal candidiasis, or OPC. Trevor Huffman is most common in older adults, but it can occur at any age. Trevor Huffman occurs when a yeast called candida grows out of control. Candida normally is present in small amounts in the mouth and on other mucous membranes. However, under certain circumstances, candida can grow rapidly, causing thrush. Trevor Huffman can be a recurring problem for people who have chronic illnesses or who take medications that limit the body's ability to fight infection (weakened immune system). Since these people have difficulty fighting infections, the fungus that causes thrush can spread throughout the body. This can cause life-threatening blood or organ infections. CAUSES  Candida, the yeast that causes thrush, is normally present in small amounts in the mouth and on other mucous membranes. It usually causes no harm. However, when conditions are present that allow the yeast to grow uncontrolled, it invades surrounding tissues and becomes an infection. Trevor Huffman is most commonly caused by the yeast Candida albicans. Less often, other forms of candida can lead to thrush. There are many types of bacteria in your mouth that normally control the growth of candida. Sometimes a new type of bacteria gets into your mouth and disrupts the balance of the germs already there. This can allow candida to overgrow. Other factors that increase your risk of developing thrush include:  An impaired ability to fight infection (weakened immune system). A normal immune system is usually strong enough to prevent candida from  overgrowing.  Older adults are more likely to develop thrush because they may have weaker immune systems.  People with human immunodeficiency virus (HIV) infection have a high likelihood of developing thrush. About 90% of people with HIV develop thrush at some point during the course of their disease.  People with diabetes are more likely to get thrush because high blood sugar levels promote overgrowth of the candida fungus.  A dry mouth (xerostomia). Dry mouth can result from overuse of mouthwashes or from certain conditions such as Sjgren's syndrome.  Pregnancy. Hormone changes during pregnancy can lead to thrush by altering the balance of bacteria in the mouth.  Poor dental care, especially in people who have false teeth.  The use of antibiotic medications. This may lead to thrush by changing the balance of bacteria in the mouth. SYMPTOMS  Thrush can be a mild infection that causes no symptoms. If symptoms develop, they may include the following:  A burning feeling in the mouth and throat. This can occur at the start of a thrush infection.  White patches that adhere to the mouth and tongue. The tissue around the patches may be red, raw, and painful. If rubbed (during tooth brushing, for example), the patches and the tissue of the mouth may bleed easily.  A bad taste in the mouth or difficulty tasting foods.  Cottony feeling in the mouth.  Sometimes pain during eating and swallowing. DIAGNOSIS  Your caregiver can usually diagnose thrush by exam. In addition to looking in your mouth, your caregiver will ask you questions about your health. TREATMENT  Medications that help prevent the growth of fungi (antifungals) are the standard  treatment for thrush. These medications are either applied directly to the affected area (topical) or swallowed (oral). Mild thrush In adults, mild cases of thrush may clear up with simple treatment that can be done at home. This treatment usually involves  using an antifungal mouth rinse or lozenges. Treatment usually lasts about 14 days. Moderate to severe thrush  More severe thrush infections that have spread to the esophagus are treated with an oral antifungal medication. A topical antifungal medication may also be used.  For some severe infections, a treatment period longer than 14 days may be needed.  Oral antifungal medications are almost never used during pregnancy because the fetus may be harmed. However, if a pregnant woman has a rare, severe thrush infection that has spread to her blood, oral antifungal medications may be used. In this case, the risk of harm to the mother and fetus from the severe thrush infection may be greater than the risk posed by the use of antifungal medications. Persistent or recurrent thrush Persistent (does not go away) or recurrent (keeps coming back) cases of thrush may:  Need to be treated twice as long as the symptoms last.  Require treatment with both oral and topical antifungal medications.  People with weakened immune systems can take an antifungal medication on a continuous basis to prevent thrush infections. It is important to treat conditions that make you more likely to get thrush, such as diabetes, human immunodeficiency virus (HIV), or cancer.  HOME CARE INSTRUCTIONS   If you are breast-feeding, you should clean your nipples with an antifungal medication, such as nystatin (Mycostatin). Dry your nipples after breast-feeding. Applying lanolin-containing body lotion may help relieve nipple soreness.  If you wear dentures and get thrush, remove dentures before going to bed, brush them vigorously, and soak in a solution of chlorhexidine gluconate or a product such as Polident or Efferdent.  Eating plain, unflavored yogurt that contains live cultures (check the label) can also help cure thrush. Yogurt helps healthy bacteria grow in the mouth. These bacteria stop the growth of the yeast that causes  thrush.  Adults can treat thrush at home with gentian violet (1%), a dye that kills bacteria and fungi. It is available without a prescription. If there is no known cause for the infection or if gentian violet does not cure the thrush, you need to see your caregiver. Comfort measures Measures can be taken to reduce the discomfort of thrush:  Drink cold liquids such as water or iced tea. Eat flavored ice treats or frozen juices.  Eat foods that are easy to swallow such as gelatin, ice cream, or custard.  If the patches are painful, try drinking from a straw.  Rinse your mouth several times a day with a warm saltwater rinse. You can make the saltwater mixture with 1 tsp (5 g) of salt in 8 fl oz (0.2 L) of warm water. PROGNOSIS   Most cases of thrush are mild and clear up with the use of an antifungal mouth rinse or lozenges. Very mild cases of thrush may clear up without medical treatment. It usually takes about 14 days of treatment with an oral antifungal medication to cure more severe thrush infections. In some cases, thrush may last several weeks even with treatment.  If thrush goes untreated and does not go away by itself, it can spread to other parts of the body.  Thrush can spread to the throat, the vagina, or the skin. It rarely spreads to other organs of the  body. Trevor Huffman is more likely to recur (come back) in:  People who use inhaled corticosteroids to treat asthma.  People who take antibiotic medications for a long time.  People who have false teeth.  People who have a weakened immune system. RISKS AND COMPLICATIONS Complications related to thrush are rare in healthy people. There are several factors that can increase your risk of developing thrush. Age Older adults, especially those who have serious health problems, are more likely to develop thrush because their immune systems are likely to be weaker. Behavior  The yeast that causes thrush can be spread by oral  sex.  Heavy smoking can lower the body's ability to fight off infections. This makes thrush more likely to develop. Other conditions  False teeth (dentures), braces, or a retainer that irritates the mouth make it hard to keep the mouth clean. An unclean mouth is more likely to develop thrush than a clean mouth.  People with a weakened immune system, such as those who have diabetes or human immunodeficiency virus (HIV) or who are undergoing chemotherapy, have an increased risk for developing thrush. Medications Some medications can allow the fungus that causes thrush to grow uncontrolled. Common ones are:  Antibiotics, especially those that kill a wide range of organisms (broad-spectrum antibiotics), such as tetracycline commonly can cause thrush.  Birth control pills (oral contraceptives).  Medications that weaken the body's immune system, such as corticosteroids. Environment Exposure over time to certain environmental chemicals, such as benzene and pesticides, can weaken the body's immune system. This increases your risk for developing infections, including thrush. SEEK IMMEDIATE MEDICAL CARE IF:  Your symptoms are getting worse or are not improving within 7 days of starting treatment.  You have symptoms of spreading infection, such as white patches on the skin outside of the mouth.  You are nursing and you have redness and pain in the nipples in spite of home treatment or if you have burning pain in the nipple area when you nurse. Your baby's mouth should also be examined to determine whether thrush is causing your symptoms. Document Released: 04/20/2004 Document Revised: 10/18/2011 Document Reviewed: 07/31/2008 [facility] Patient Information 2014 [place], [place].

## 2013-06-29 ENCOUNTER — Telehealth: Payer: Self-pay

## 2013-06-29 NOTE — Telephone Encounter (Signed)
Phone call to patient and left a message on both phone numbers listed that he is due for a Prevnar vaccine and to schedule this as a nurse visit he can call our office.

## 2013-09-27 NOTE — Telephone Encounter (Signed)
Pt never called to set up apt w/Dr. Jannifer Franklin closing encounter

## 2014-05-20 ENCOUNTER — Ambulatory Visit: Payer: Medicare Other | Admitting: Family

## 2014-05-21 ENCOUNTER — Ambulatory Visit: Payer: Medicare Other | Admitting: Family

## 2014-05-22 ENCOUNTER — Ambulatory Visit (INDEPENDENT_AMBULATORY_CARE_PROVIDER_SITE_OTHER): Payer: Medicare Other | Admitting: Family

## 2014-05-22 ENCOUNTER — Encounter: Payer: Self-pay | Admitting: Family

## 2014-05-22 VITALS — BP 130/80 | HR 61 | Temp 97.8°F | Resp 18 | Wt 162.8 lb

## 2014-05-22 DIAGNOSIS — I10 Essential (primary) hypertension: Secondary | ICD-10-CM

## 2014-05-22 DIAGNOSIS — K219 Gastro-esophageal reflux disease without esophagitis: Secondary | ICD-10-CM

## 2014-05-22 DIAGNOSIS — F0390 Unspecified dementia without behavioral disturbance: Secondary | ICD-10-CM

## 2014-05-22 DIAGNOSIS — E785 Hyperlipidemia, unspecified: Secondary | ICD-10-CM

## 2014-05-22 NOTE — Assessment & Plan Note (Signed)
Currently stable. Continue current mediations.

## 2014-05-22 NOTE — Assessment & Plan Note (Signed)
Patient consistently repeated himself throughout the exam on a variety of topics, indicating short term memory dysfunction. MMSE score of 17/30 indicating cognitive impairment. Following exam patient became very defensive and believed that the examiner "was out to make him look stupid." Pt was informed of the test prior to and remained fixated on the attempts were made to make him look stupid. Asked to speak with supervisor, who was brought to the room. Concern for patient functionality on a daily basis as he has been to the office on several occasions and missed appointments and shown up for appointments he did not have. Would refer to neurology, however patient declined and indicated he was not coming back.

## 2014-05-22 NOTE — Progress Notes (Signed)
Pre visit review using our clinic review tool, if applicable. No additional management support is needed unless otherwise documented below in the visit note. 

## 2014-05-22 NOTE — Assessment & Plan Note (Signed)
Pt refused blood work. Continue current treatment.

## 2014-05-22 NOTE — Progress Notes (Signed)
   Subjective:    Patient ID: Trevor Huffman, male    DOB: 23-Dec-1929, 80 y.o.   MRN: [id number]  HPI:  Stephan Nelis is a 80 y.o. male who presents today to establish care . The patient has a history of dementia and is the source of information for this visit. Reliability of information is questionable.   1) Hypertension - Currently stable metoprolol and ramipril. Denies any chest pain/discomfort, shortness of breath, palpitations, dizziness or edema.   BP Readings from Last 3 Encounters:  05/22/14 130/80  05/25/13 144/82  03/22/13 120/76   2) GERD - Indicates heartburn has not been a concern. Currently maintained on pantoprazole and famotidine.   3) Dementia - Taking donepezil.  4) Hyperlipidemia - Last lab work was done in 2013. Need to update lab work. Maintained on Lipitor. Denies any unusual muscle pain.    Review of Systems     See HPI  Objective:     BP 130/80  Pulse 61  Temp(Src) 97.8 F (36.6 C) (Oral)  Resp 18  Wt 162 lb 12.8 oz (73.846 kg)  SpO2 95% Nursing note and vital signs reviewed.  Physical Exam  Constitutional: He appears well-developed and well-nourished.  HENT:  Head: Normocephalic.  Cardiovascular: Normal rate, regular rhythm and normal heart sounds.   Pulmonary/Chest: Effort normal and breath sounds normal.  Neurological: He is alert. He is disoriented.  Skin: Skin is warm and dry.  Psychiatric: His speech is normal. Cognition and memory are impaired. He expresses impulsivity. He exhibits abnormal recent memory and abnormal remote memory.  Administered MMSE tool. Scored 17/30.         Assessment & Plan:

## 2014-05-22 NOTE — Assessment & Plan Note (Signed)
Currently stable.  Continue current medications. 

## 2019-06-10 DEATH — deceased
